# Patient Record
Sex: Male | Born: 1955 | Race: White | Hispanic: No | Marital: Married | Smoking: Former smoker
Health system: Southern US, Community
[De-identification: ages and names within clinical notes are randomized; demographics above are authoritative.]

## PROBLEM LIST (undated history)

## (undated) DIAGNOSIS — G4733 Obstructive sleep apnea (adult) (pediatric): Secondary | ICD-10-CM

## (undated) DIAGNOSIS — J45909 Unspecified asthma, uncomplicated: Secondary | ICD-10-CM

## (undated) DIAGNOSIS — Z87442 Personal history of urinary calculi: Secondary | ICD-10-CM

## (undated) DIAGNOSIS — G4730 Sleep apnea, unspecified: Secondary | ICD-10-CM

## (undated) DIAGNOSIS — I872 Venous insufficiency (chronic) (peripheral): Secondary | ICD-10-CM

## (undated) DIAGNOSIS — J189 Pneumonia, unspecified organism: Secondary | ICD-10-CM

## (undated) DIAGNOSIS — Z5320 Procedure and treatment not carried out because of patient's decision for unspecified reasons: Secondary | ICD-10-CM

## (undated) HISTORY — DX: Unspecified asthma, uncomplicated: J45.909

## (undated) HISTORY — PX: HEMORROIDECTOMY: SUR656

## (undated) HISTORY — DX: Obstructive sleep apnea (adult) (pediatric): G47.33

## (undated) HISTORY — DX: Morbid (severe) obesity due to excess calories: E66.01

## (undated) HISTORY — DX: Procedure and treatment not carried out because of patient's decision for unspecified reasons: Z53.20

## (undated) HISTORY — DX: Venous insufficiency (chronic) (peripheral): I87.2

---

## 1998-05-23 ENCOUNTER — Ambulatory Visit (HOSPITAL_COMMUNITY): Admission: RE | Admit: 1998-05-23 | Discharge: 1998-05-23 | Payer: Self-pay | Admitting: General Surgery

## 1998-05-23 ENCOUNTER — Emergency Department (HOSPITAL_COMMUNITY): Admission: EM | Admit: 1998-05-23 | Discharge: 1998-05-23 | Payer: Self-pay | Admitting: Emergency Medicine

## 2014-10-22 SURGERY — Surgical Case
Anesthesia: *Unknown

## 2015-12-21 DIAGNOSIS — Z1389 Encounter for screening for other disorder: Secondary | ICD-10-CM | POA: Diagnosis not present

## 2015-12-21 DIAGNOSIS — Z6841 Body Mass Index (BMI) 40.0 and over, adult: Secondary | ICD-10-CM | POA: Diagnosis not present

## 2015-12-21 DIAGNOSIS — R1032 Left lower quadrant pain: Secondary | ICD-10-CM | POA: Diagnosis not present

## 2015-12-27 DIAGNOSIS — R1032 Left lower quadrant pain: Secondary | ICD-10-CM | POA: Diagnosis not present

## 2015-12-27 DIAGNOSIS — R1084 Generalized abdominal pain: Secondary | ICD-10-CM | POA: Diagnosis not present

## 2016-02-18 DIAGNOSIS — J208 Acute bronchitis due to other specified organisms: Secondary | ICD-10-CM | POA: Diagnosis not present

## 2016-06-16 DIAGNOSIS — M79631 Pain in right forearm: Secondary | ICD-10-CM | POA: Diagnosis not present

## 2016-06-16 DIAGNOSIS — M791 Myalgia: Secondary | ICD-10-CM | POA: Diagnosis not present

## 2016-06-16 DIAGNOSIS — Z6838 Body mass index (BMI) 38.0-38.9, adult: Secondary | ICD-10-CM | POA: Diagnosis not present

## 2016-06-17 DIAGNOSIS — M25521 Pain in right elbow: Secondary | ICD-10-CM | POA: Diagnosis not present

## 2016-06-19 DIAGNOSIS — M25521 Pain in right elbow: Secondary | ICD-10-CM | POA: Diagnosis not present

## 2016-06-19 DIAGNOSIS — M19021 Primary osteoarthritis, right elbow: Secondary | ICD-10-CM | POA: Diagnosis not present

## 2016-06-22 DIAGNOSIS — M25521 Pain in right elbow: Secondary | ICD-10-CM | POA: Diagnosis not present

## 2017-01-19 HISTORY — PX: DENTAL SURGERY: SHX609

## 2017-07-06 DIAGNOSIS — R1031 Right lower quadrant pain: Secondary | ICD-10-CM | POA: Diagnosis not present

## 2017-07-06 DIAGNOSIS — K4030 Unilateral inguinal hernia, with obstruction, without gangrene, not specified as recurrent: Secondary | ICD-10-CM | POA: Diagnosis not present

## 2017-07-06 DIAGNOSIS — J45998 Other asthma: Secondary | ICD-10-CM | POA: Diagnosis not present

## 2017-07-08 DIAGNOSIS — Z01818 Encounter for other preprocedural examination: Secondary | ICD-10-CM | POA: Diagnosis not present

## 2017-07-08 DIAGNOSIS — Z01810 Encounter for preprocedural cardiovascular examination: Secondary | ICD-10-CM | POA: Diagnosis not present

## 2017-07-08 DIAGNOSIS — K4090 Unilateral inguinal hernia, without obstruction or gangrene, not specified as recurrent: Secondary | ICD-10-CM | POA: Diagnosis not present

## 2017-07-16 DIAGNOSIS — T8379XA Other specified complications due to other genitourinary prosthetic materials, initial encounter: Secondary | ICD-10-CM | POA: Diagnosis not present

## 2017-07-16 DIAGNOSIS — K420 Umbilical hernia with obstruction, without gangrene: Secondary | ICD-10-CM | POA: Diagnosis not present

## 2017-07-16 DIAGNOSIS — R001 Bradycardia, unspecified: Secondary | ICD-10-CM | POA: Diagnosis not present

## 2017-07-16 DIAGNOSIS — G4733 Obstructive sleep apnea (adult) (pediatric): Secondary | ICD-10-CM | POA: Diagnosis not present

## 2017-07-16 DIAGNOSIS — K4030 Unilateral inguinal hernia, with obstruction, without gangrene, not specified as recurrent: Secondary | ICD-10-CM | POA: Diagnosis not present

## 2017-07-16 DIAGNOSIS — R55 Syncope and collapse: Secondary | ICD-10-CM | POA: Diagnosis not present

## 2017-07-16 DIAGNOSIS — G8918 Other acute postprocedural pain: Secondary | ICD-10-CM | POA: Diagnosis not present

## 2017-07-16 DIAGNOSIS — K4090 Unilateral inguinal hernia, without obstruction or gangrene, not specified as recurrent: Secondary | ICD-10-CM | POA: Diagnosis not present

## 2017-07-16 DIAGNOSIS — K429 Umbilical hernia without obstruction or gangrene: Secondary | ICD-10-CM | POA: Diagnosis not present

## 2017-07-16 DIAGNOSIS — J45909 Unspecified asthma, uncomplicated: Secondary | ICD-10-CM | POA: Diagnosis not present

## 2017-07-17 DIAGNOSIS — K4030 Unilateral inguinal hernia, with obstruction, without gangrene, not specified as recurrent: Secondary | ICD-10-CM | POA: Diagnosis not present

## 2017-07-17 DIAGNOSIS — K429 Umbilical hernia without obstruction or gangrene: Secondary | ICD-10-CM | POA: Diagnosis not present

## 2017-07-17 DIAGNOSIS — R001 Bradycardia, unspecified: Secondary | ICD-10-CM | POA: Diagnosis not present

## 2017-07-19 DIAGNOSIS — K4030 Unilateral inguinal hernia, with obstruction, without gangrene, not specified as recurrent: Secondary | ICD-10-CM | POA: Diagnosis not present

## 2017-07-19 DIAGNOSIS — K429 Umbilical hernia without obstruction or gangrene: Secondary | ICD-10-CM | POA: Diagnosis not present

## 2019-04-03 ENCOUNTER — Ambulatory Visit: Payer: Self-pay | Admitting: General Surgery

## 2019-04-03 NOTE — H&P (View-Only) (Signed)
History of Present Illness (Yahia Bottger MD; 04/03/2019 2:02 PM) The patient is a 63 year old male who presents with an incisional hernia. Chief Complaint: Recurrent incisional hernia  Patient is a 63-year-old male who previously had undergone a laparoscopic left inguinal hernia repair with mesh and laparoscopic umbilical hernia repair with mesh, in 2017. Patient subsequently had a attempted right robotic inguinal hernia repair which resulted in bleeding from the epigastric vessel that required clipping. This also required exploratory laparotomy via a large midline incision. This is done in 2019. Patient states that approximately 6 months after surgery he noticed a bulge upper epigastric region. He states that since that time the bulge is getting larger. He states he does have some discomfort and pain to both hernias. Patient recently underwent CT scan which I reviewed personally. 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 Patient had no signs or symptoms of incarceration or strangulation.    Past Surgical History (Chanel Nolan, CMA; 04/03/2019 1:39 PM) Anal Fissure Repair  Laparoscopic Inguinal Hernia Surgery  Right.  Diagnostic Studies History (Chanel Nolan, CMA; 04/03/2019 1:39 PM) Colonoscopy  never  Allergies (Chanel Nolan, CMA; 04/03/2019 1:39 PM) No Known Allergies [04/03/2019]: No Known Drug Allergies [04/03/2019]: Allergies Reconciled   Medication History (Chanel Nolan, CMA; 04/03/2019 1:39 PM) No Current Medications Medications Reconciled  Social History (Chanel Nolan, CMA; 04/03/2019 1:39 PM) Alcohol use  Occasional alcohol use. Caffeine use  Coffee, Tea. No drug use  Tobacco use  Never smoker.  Family History (Chanel Nolan, CMA; 04/03/2019 1:39 PM) Arthritis  Mother. Cancer  Father. Diabetes Mellitus  Mother.  Other Problems (Chanel Nolan, CMA; 04/03/2019 1:39 PM) Asthma   Hemorrhoids  Inguinal Hernia  Umbilical Hernia Repair     Review of Systems (Kelcy Baeten MD; 04/03/2019 2:00 PM) General Not Present- Appetite Loss, Chills, Fatigue, Fever, Night Sweats, Weight Gain and Weight Loss. Skin Not Present- Change in Wart/Mole, Dryness, Hives, Jaundice, New Lesions, Non-Healing Wounds, Rash and Ulcer. HEENT Present- Seasonal Allergies and Wears glasses/contact lenses. Not Present- Earache, Hearing Loss, Hoarseness, Nose Bleed, Oral Ulcers, Ringing in the Ears, Sinus Pain, Sore Throat, Visual Disturbances and Yellow Eyes. Respiratory Not Present- Bloody sputum, Chronic Cough, Difficulty Breathing, Snoring and Wheezing. Breast Not Present- Breast Mass, Breast Pain, Nipple Discharge and Skin Changes. Cardiovascular Not Present- Chest Pain, Difficulty Breathing Lying Down, Leg Cramps, Palpitations, Rapid Heart Rate, Shortness of Breath and Swelling of Extremities. Gastrointestinal Present- Abdominal Pain. Not Present- Bloating, Bloody Stool, Change in Bowel Habits, Chronic diarrhea, Constipation, Difficulty Swallowing, Excessive gas, Gets full quickly at meals, Hemorrhoids, Indigestion, Nausea, Rectal Pain and Vomiting. Male Genitourinary Not Present- Blood in Urine, Change in Urinary Stream, Frequency, Impotence, Nocturia, Painful Urination, Urgency and Urine Leakage. Musculoskeletal Not Present- Back Pain, Joint Pain, Joint Stiffness, Muscle Pain, Muscle Weakness and Swelling of Extremities. Neurological Not Present- Decreased Memory, Fainting, Headaches, Numbness, Seizures, Tingling, Tremor, Trouble walking and Weakness. Psychiatric Not Present- Anxiety, Bipolar, Change in Sleep Pattern, Depression, Fearful and Frequent crying. Endocrine Not Present- Cold Intolerance, Excessive Hunger, Hair Changes, Heat Intolerance, Hot flashes and New Diabetes. Hematology Not Present- Blood Thinners, Easy Bruising, Excessive bleeding, Gland problems, HIV and Persistent  Infections. All other systems negative  Vitals (Chanel Nolan CMA; 04/03/2019 1:40 PM) 04/03/2019 1:39 PM Weight: 281.5 lb Height: 72in Body Surface Area: 2.46 m Body Mass Index: 38.18 kg/m  Temp.: 98.3F  Pulse: 75 (Regular)        Physical Exam (  Axel Filler MD; 04/03/2019 2:02 PM) The physical exam findings are as follows: Note:Constitutional: No acute distress, conversant, appears stated age  Eyes: Anicteric sclerae, moist conjunctiva, no lid lag  Neck: No thyromegaly, trachea midline, no cervical lymphadenopathy  Lungs: Clear to auscultation biilaterally, normal respiratory effot  Cardiovascular: regular rate & rhythm, no murmurs, no peripheal edema, pedal pulses 2+  GI: Soft, no masses or hepatosplenomegaly, non-tender to palpation  MSK: Normal gait, no clubbing cyanosis, edema  Skin: No rashes, palpation reveals normal skin turgor  Psychiatric: Appropriate judgment and insight, oriented to person, place, and time  Abdomen Inspection Hernias - Incisional - Reducible(Large midline incision, reducible multiple defects).    Assessment & Plan Axel Filler MD; 04/03/2019 2:04 PM) INCISIONAL HERNIA, WITHOUT OBSTRUCTION OR GANGRENE (K43.2) Impression: Patient is a 63 year old male with a history of a recurrent incisional hernia, midline 1. Will proceed to operative for exploratory laparoscopy, lysis of adhesions, incisional hernia repair with mesh, TAR versus retrorectus. 2. All risks and benefits were discussed with the patient to generally include, but not limited to: infection, bleeding, damage to surrounding structures, acute and chronic nerve pain, and recurrence. Alternatives were offered and described. All questions were answered and the patient voiced understanding of the procedure and wishes to proceed at this point with hernia repair.   I reviewed the patient's external notes from the referring physicians as well as consulting physician team.  Each of the radiologic studies and lab studies were independently reviewed and interpreted. I discussed the results of the above studies and how they relate to the patient's surgical problems.

## 2019-04-03 NOTE — H&P (Signed)
History of Present Illness Ralene Ok MD; 04/03/2019 2:02 PM) The patient is a 64 year old male who presents with an incisional hernia. Chief Complaint: Recurrent incisional hernia  Patient is a 64 year old male who previously had undergone a laparoscopic left inguinal hernia repair with mesh and laparoscopic umbilical hernia repair with mesh, in 2017. Patient subsequently had a attempted right robotic inguinal hernia repair which resulted in bleeding from the epigastric vessel that required clipping. This also required exploratory laparotomy via a large midline incision. This is done in 2019. Patient states that approximately 6 months after surgery he noticed a bulge upper epigastric region. He states that since that time the bulge is getting larger. He states he does have some discomfort and pain to both hernias. Patient recently underwent CT scan which I reviewed personally. 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 Patient had no signs or symptoms of incarceration or strangulation.    Past Surgical History [facility] Teressa Senter, [place]; 04/03/2019 1:39 PM) Anal Fissure Repair  Laparoscopic Inguinal Hernia Surgery  Right.  Diagnostic Studies History [facility] Teressa Senter, CMA; 04/03/2019 1:39 PM) Colonoscopy  never  Allergies (Chanel Teressa Senter, CMA; 04/03/2019 1:39 PM) No Known Allergies [04/03/2019]: No Known Drug Allergies [04/03/2019]: Allergies Reconciled   Medication History (Chanel Teressa Senter, [place]; 04/03/2019 1:39 PM) No Current Medications Medications Reconciled  Social History Antonietta Jewel, CMA; 04/03/2019 1:39 PM) Alcohol use  Occasional alcohol use. Caffeine use  Coffee, Tea. No drug use  Tobacco use  Never smoker.  Family History Antonietta Jewel, [place]; 04/03/2019 1:39 PM) Arthritis  Mother. Cancer  Father. Diabetes Mellitus  Mother.  Other Problems Antonietta Jewel, CMA; 04/03/2019 1:39 PM) Asthma   Hemorrhoids  Inguinal Hernia  Umbilical Hernia Repair     Review of Systems Ralene Ok MD; 04/03/2019 2:00 PM) General Not Present- Appetite Loss, Chills, Fatigue, Fever, Night Sweats, Weight Gain and Weight Loss. Skin Not Present- Change in Wart/Mole, Dryness, Hives, Jaundice, New Lesions, Non-Healing Wounds, Rash and Ulcer. HEENT Present- Seasonal Allergies and Wears glasses/contact lenses. Not Present- Earache, Hearing Loss, Hoarseness, Nose Bleed, Oral Ulcers, Ringing in the Ears, Sinus Pain, Sore Throat, Visual Disturbances and Yellow Eyes. Respiratory Not Present- Bloody sputum, Chronic Cough, Difficulty Breathing, Snoring and Wheezing. Breast Not Present- Breast Mass, Breast Pain, Nipple Discharge and Skin Changes. Cardiovascular Not Present- Chest Pain, Difficulty Breathing Lying Down, Leg Cramps, Palpitations, Rapid Heart Rate, Shortness of Breath and Swelling of Extremities. Gastrointestinal Present- Abdominal Pain. Not Present- Bloating, Bloody Stool, Change in Bowel Habits, Chronic diarrhea, Constipation, Difficulty Swallowing, Excessive gas, Gets full quickly at meals, Hemorrhoids, Indigestion, Nausea, Rectal Pain and Vomiting. Male Genitourinary Not Present- Blood in Urine, Change in Urinary Stream, Frequency, Impotence, Nocturia, Painful Urination, Urgency and Urine Leakage. Musculoskeletal Not Present- Back Pain, Joint Pain, Joint Stiffness, Muscle Pain, Muscle Weakness and Swelling of Extremities. Neurological Not Present- Decreased Memory, Fainting, Headaches, Numbness, Seizures, Tingling, Tremor, Trouble walking and Weakness. Psychiatric Not Present- Anxiety, Bipolar, Change in Sleep Pattern, Depression, Fearful and Frequent crying. Endocrine Not Present- Cold Intolerance, Excessive Hunger, Hair Changes, Heat Intolerance, Hot flashes and New Diabetes. Hematology Not Present- Blood Thinners, Easy Bruising, Excessive bleeding, Gland problems, HIV and Persistent  Infections. All other systems negative  Vitals (Chanel Nolan CMA; 04/03/2019 1:40 PM) 04/03/2019 1:39 PM Weight: 281.5 lb Height: 72in Body Surface Area: 2.46 m Body Mass Index: 38.18 kg/m  Temp.: 98.21F  Pulse: 75 (Regular)        Physical Exam (  Axel Filler MD; 04/03/2019 2:02 PM) The physical exam findings are as follows: Note:Constitutional: No acute distress, conversant, appears stated age  Eyes: Anicteric sclerae, moist conjunctiva, no lid lag  Neck: No thyromegaly, trachea midline, no cervical lymphadenopathy  Lungs: Clear to auscultation biilaterally, normal respiratory effot  Cardiovascular: regular rate & rhythm, no murmurs, no peripheal edema, pedal pulses 2+  GI: Soft, no masses or hepatosplenomegaly, non-tender to palpation  MSK: Normal gait, no clubbing cyanosis, edema  Skin: No rashes, palpation reveals normal skin turgor  Psychiatric: Appropriate judgment and insight, oriented to person, place, and time  Abdomen Inspection Hernias - Incisional - Reducible(Large midline incision, reducible multiple defects).    Assessment & Plan Axel Filler MD; 04/03/2019 2:04 PM) INCISIONAL HERNIA, WITHOUT OBSTRUCTION OR GANGRENE (K43.2) Impression: Patient is a 63 year old male with a history of a recurrent incisional hernia, midline 1. Will proceed to operative for exploratory laparoscopy, lysis of adhesions, incisional hernia repair with mesh, TAR versus retrorectus. 2. All risks and benefits were discussed with the patient to generally include, but not limited to: infection, bleeding, damage to surrounding structures, acute and chronic nerve pain, and recurrence. Alternatives were offered and described. All questions were answered and the patient voiced understanding of the procedure and wishes to proceed at this point with hernia repair.   I reviewed the patient's external notes from the referring physicians as well as consulting physician team.  Each of the radiologic studies and lab studies were independently reviewed and interpreted. I discussed the results of the above studies and how they relate to the patient's surgical problems.

## 2019-04-18 NOTE — Progress Notes (Signed)
Jacob Page  Your procedure is scheduled on Wednesday April 7.  Report to [facility] Main Entrance "A" at 08:15 A.M., and check in at the Admitting office.  Call this number if you have problems the morning of surgery: [phone]  Call [phone] if you have any questions prior to your surgery date Monday-Friday 8am-4pm   Remember: Do not eat after midnight the night before your surgery  You may drink clear liquids until 07:15 A.M. the morning of your surgery.   Clear liquids allowed are: Water, Non-Citrus Juices (without pulp), Carbonated Beverages, Clear Tea, Black Coffee Only, and Gatorade   No medications morning of surgery.  As of today, STOP taking any Aspirin (unless otherwise instructed by your surgeon), Aleve, Naproxen, Ibuprofen, Motrin, Advil, Goody's, BC's, all herbal medications, fish oil, and all vitamins.    The Morning of Surgery  Do not wear jewelry  Do not wear lotions, powders, perfumes, or deodorant Men may shave face and neck.  Do not bring valuables to the hospital.  [facility] is not responsible for any belongings or valuables.  If you are a smoker, DO NOT Smoke 24 hours prior to surgery  If you wear a CPAP at night please bring your mask the morning of surgery   Remember that you must have someone to transport you home after your surgery, and remain with you for 24 hours if you are discharged the same day.   Please bring cases for contacts, glasses, hearing aids, dentures or bridgework because it cannot be worn into surgery.    Leave your suitcase in the car.  After surgery it may be brought to your room.  For patients admitted to the hospital, discharge time will be determined by your treatment team.  Patients discharged the day of surgery will not be allowed to drive home.    Special instructions:   [place]- Preparing For Surgery  Before surgery, you can play an important role. Because skin is not sterile, your skin needs to be as  free of germs as possible. You can reduce the number of germs on your skin by washing with CHG (chlorahexidine gluconate) Soap before surgery.  CHG is an antiseptic cleaner which kills germs and bonds with the skin to continue killing germs even after washing.    Oral Hygiene is also important to reduce your risk of infection.  Remember - BRUSH YOUR TEETH THE MORNING OF SURGERY WITH YOUR REGULAR TOOTHPASTE  Please do not use if you have an allergy to CHG or antibacterial soaps. If your skin becomes reddened/irritated stop using the CHG.  Do not shave (including legs and underarms) for at least 48 hours prior to first CHG shower. It is OK to shave your face.  Please follow these instructions carefully.   1. Shower the NIGHT BEFORE SURGERY and the MORNING OF SURGERY with CHG Soap.   2. If you chose to wash your hair and body, wash as usual with your normal shampoo and body-wash/soap.  3. Rinse your hair and body thoroughly to remove the shampoo and soap.  4. Apply CHG directly to the skin (ONLY FROM THE NECK DOWN) and wash gently with a scrungie or a clean washcloth.   5. Do not use on open wounds or open sores. Avoid contact with your eyes, ears, mouth and genitals (private parts). Wash Face and genitals (private parts)  with your normal soap.   6. Wash thoroughly, paying special attention to the area where your surgery will be  performed.  7. Thoroughly rinse your body with warm water from the neck down.  8. DO NOT shower/wash with your normal soap after using and rinsing off the CHG Soap.  9. Pat yourself dry with a CLEAN TOWEL.  10. Wear CLEAN PAJAMAS to bed the night before surgery  11. Place CLEAN SHEETS on your bed the night of your first shower and DO NOT SLEEP WITH PETS.  12. Wear comfortable clothes the morning of surgery.     Day of Surgery:  Please shower the morning of surgery with the CHG soap Do not apply any deodorants/lotions. Please wear clean clothes to the  hospital/surgery center.   Remember to brush your teeth WITH YOUR REGULAR TOOTHPASTE.   Please read over the following fact sheets that you were given.

## 2019-04-19 ENCOUNTER — Other Ambulatory Visit: Payer: Self-pay

## 2019-04-19 ENCOUNTER — Encounter (HOSPITAL_COMMUNITY): Payer: Self-pay

## 2019-04-19 ENCOUNTER — Encounter (HOSPITAL_COMMUNITY)
Admission: RE | Admit: 2019-04-19 | Discharge: 2019-04-19 | Disposition: A | Discharge status: Home / Self Care | Payer: 59 | Source: Ambulatory Visit | Attending: General Surgery | Admitting: General Surgery

## 2019-04-19 DIAGNOSIS — Z01812 Encounter for preprocedural laboratory examination: Secondary | ICD-10-CM | POA: Insufficient documentation

## 2019-04-19 HISTORY — DX: Sleep apnea, unspecified: G47.30

## 2019-04-19 HISTORY — DX: Personal history of urinary calculi: Z87.442

## 2019-04-19 HISTORY — DX: Pneumonia, unspecified organism: J18.9

## 2019-04-19 LAB — CBC
HCT: 48.2 % (ref 39.0–52.0)
Hemoglobin: 15.9 g/dL (ref 13.0–17.0)
MCH: 28.9 pg (ref 26.0–34.0)
MCHC: 33 g/dL (ref 30.0–36.0)
MCV: 87.5 fL (ref 80.0–100.0)
Platelets: 276 10*3/uL (ref 150–400)
RBC: 5.51 MIL/uL (ref 4.22–5.81)
RDW: 13.2 % (ref 11.5–15.5)
WBC: 7.7 10*3/uL (ref 4.0–10.5)
nRBC: 0 % (ref 0.0–0.2)

## 2019-04-19 NOTE — Progress Notes (Addendum)
PCP - Dr. 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 He has no cardiac history, doesn't take any medications for blood pressure management.  ECHO - pt denies Cardiac Cath - pt denies  Sleep Study - yes - per pt "sleep study was done where I had to stay overnight. It was about 35 years ago. I never got a CPAP because I felt that I slept fine and didn't need it." CPAP - no  Per pt, wife checks his blood sugars occasionally (wife is diabetic, so they have a CBG meter), but his sugars are never over 120s.   Per pt he has had 2 previous hernia repairs.  [facility] hospital - 2017 [facility] - July 17, 2017 (care everywhere)   Fayrene Fearing, [place] aware - documents not requested.   ERAS Protcol - yes - no drink ordered   COVID TEST- 04/22/19  Coronavirus Screening  Have you experienced the following symptoms:  Cough yes/no: No Fever (>100.64F)  yes/no: No Runny nose yes/no: No Sore throat yes/no: No Difficulty breathing/shortness of breath  yes/no: No  Have you or a family member traveled in the last 14 days and where? yes/no: No   If the patient indicates "YES" to the above questions, their PAT will be rescheduled to limit the exposure to others and, the surgeon will be notified. THE PATIENT WILL NEED TO BE ASYMPTOMATIC FOR 14 DAYS.   If the patient is not experiencing any of these symptoms, the PAT nurse will instruct them to NOT bring anyone with them to their appointment since they may have these symptoms or traveled as well.   Please remind your patients and families that hospital visitation restrictions are in effect and the importance of the restrictions.     Anesthesia review: n/a   Patient denies shortness of breath, fever, cough and chest pain at PAT appointment   All instructions explained to the  patient, with a verbal understanding of the material. Patient agrees to go over the instructions while at home for a better understanding. Patient also instructed to self quarantine after being tested for COVID-19. The opportunity to ask questions was provided.

## 2019-04-22 ENCOUNTER — Other Ambulatory Visit (HOSPITAL_COMMUNITY)
Admission: RE | Admit: 2019-04-22 | Discharge: 2019-04-22 | Disposition: A | Discharge status: Home / Self Care | Payer: 59 | Source: Ambulatory Visit | Attending: General Surgery | Admitting: General Surgery

## 2019-04-22 DIAGNOSIS — Z20822 Contact with and (suspected) exposure to covid-19: Secondary | ICD-10-CM | POA: Diagnosis not present

## 2019-04-22 DIAGNOSIS — Z01812 Encounter for preprocedural laboratory examination: Secondary | ICD-10-CM | POA: Insufficient documentation

## 2019-04-22 LAB — SARS CORONAVIRUS 2 (TAT 6-24 HRS): SARS Coronavirus 2: NEGATIVE

## 2019-04-25 MED ORDER — DEXTROSE 5 % IV SOLN
3.0000 g | INTRAVENOUS | Status: AC
  Administered 2019-04-26: 10:00:00 3 g via INTRAVENOUS
  Filled 2019-04-25: qty 3000
  Filled 2019-04-25: qty 3

## 2019-04-26 ENCOUNTER — Inpatient Hospital Stay (HOSPITAL_COMMUNITY)
Admission: RE | Admit: 2019-04-26 | Discharge: 2019-04-29 | DRG: 337 | Disposition: A | Discharge status: Home / Self Care | Payer: 59 | Attending: General Surgery | Admitting: General Surgery

## 2019-04-26 ENCOUNTER — Ambulatory Visit (HOSPITAL_COMMUNITY): Payer: 59 | Admitting: Physician Assistant

## 2019-04-26 ENCOUNTER — Encounter (HOSPITAL_COMMUNITY)
Admission: RE | Disposition: A | Discharge status: Home / Self Care | Payer: Self-pay | Source: Home / Self Care | Attending: General Surgery

## 2019-04-26 ENCOUNTER — Encounter (HOSPITAL_COMMUNITY): Payer: Self-pay | Admitting: General Surgery

## 2019-04-26 ENCOUNTER — Ambulatory Visit (HOSPITAL_COMMUNITY): Payer: 59 | Admitting: Certified Registered"

## 2019-04-26 ENCOUNTER — Other Ambulatory Visit: Payer: Self-pay

## 2019-04-26 DIAGNOSIS — Z9889 Other specified postprocedural states: Secondary | ICD-10-CM

## 2019-04-26 DIAGNOSIS — K660 Peritoneal adhesions (postprocedural) (postinfection): Secondary | ICD-10-CM | POA: Diagnosis present

## 2019-04-26 DIAGNOSIS — Z833 Family history of diabetes mellitus: Secondary | ICD-10-CM

## 2019-04-26 DIAGNOSIS — Z8719 Personal history of other diseases of the digestive system: Secondary | ICD-10-CM

## 2019-04-26 DIAGNOSIS — Z8261 Family history of arthritis: Secondary | ICD-10-CM

## 2019-04-26 DIAGNOSIS — J45909 Unspecified asthma, uncomplicated: Secondary | ICD-10-CM | POA: Diagnosis present

## 2019-04-26 DIAGNOSIS — M62838 Other muscle spasm: Secondary | ICD-10-CM | POA: Diagnosis not present

## 2019-04-26 DIAGNOSIS — G4730 Sleep apnea, unspecified: Secondary | ICD-10-CM | POA: Diagnosis present

## 2019-04-26 DIAGNOSIS — K432 Incisional hernia without obstruction or gangrene: Principal | ICD-10-CM | POA: Diagnosis present

## 2019-04-26 DIAGNOSIS — Z87891 Personal history of nicotine dependence: Secondary | ICD-10-CM

## 2019-04-26 HISTORY — PX: INCISIONAL HERNIA REPAIR: SHX193

## 2019-04-26 HISTORY — PX: EXPLORATORY LAPAROTOMY: SUR591

## 2019-04-26 HISTORY — PX: LYSIS OF ADHESION: SHX5961

## 2019-04-26 HISTORY — PX: INSERTION OF MESH: SHX5868

## 2019-04-26 HISTORY — PX: LAPAROTOMY: SHX154

## 2019-04-26 LAB — TYPE AND SCREEN
ABO/RH(D): A NEG
Antibody Screen: NEGATIVE

## 2019-04-26 LAB — ABO/RH: ABO/RH(D): A NEG

## 2019-04-26 SURGERY — LAPAROTOMY, EXPLORATORY
Anesthesia: General | Site: Abdomen

## 2019-04-26 MED ORDER — ALUM & MAG HYDROXIDE-SIMETH 200-200-20 MG/5ML PO SUSP
30.0000 mL | Freq: Four times a day (QID) | ORAL | Status: DC | PRN
  Administered 2019-04-26 – 2019-04-27 (×2): 30 mL via ORAL
  Filled 2019-04-26 (×3): qty 30

## 2019-04-26 MED ORDER — PHENYLEPHRINE HCL-NACL 10-0.9 MG/250ML-% IV SOLN
INTRAVENOUS | Status: DC | PRN
  Administered 2019-04-26: 30 ug/min via INTRAVENOUS

## 2019-04-26 MED ORDER — PROMETHAZINE HCL 25 MG/ML IJ SOLN
INTRAMUSCULAR | Status: AC
  Filled 2019-04-26: qty 1

## 2019-04-26 MED ORDER — OXYCODONE HCL 5 MG PO TABS: 5.0000 mg | ORAL_TABLET | Freq: Once | ORAL | Status: AC | PRN

## 2019-04-26 MED ORDER — HYDRALAZINE HCL 20 MG/ML IJ SOLN: 10.0000 mg | INTRAMUSCULAR | Status: DC | PRN

## 2019-04-26 MED ORDER — ACETAMINOPHEN 10 MG/ML IV SOLN
1000.0000 mg | Freq: Four times a day (QID) | INTRAVENOUS | Status: AC
  Administered 2019-04-26 – 2019-04-27 (×4): 1000 mg via INTRAVENOUS
  Filled 2019-04-26 (×4): qty 100

## 2019-04-26 MED ORDER — ONDANSETRON 4 MG PO TBDP: 4.0000 mg | ORAL_TABLET | Freq: Four times a day (QID) | ORAL | Status: DC | PRN

## 2019-04-26 MED ORDER — BUPIVACAINE LIPOSOME 1.3 % IJ SUSP
INTRAMUSCULAR | Status: DC | PRN
  Administered 2019-04-26: 20 mL

## 2019-04-26 MED ORDER — ACETAMINOPHEN 500 MG PO TABS: 1000.0000 mg | ORAL_TABLET | ORAL | Status: AC

## 2019-04-26 MED ORDER — ONDANSETRON HCL 4 MG/2ML IJ SOLN
INTRAMUSCULAR | Status: DC | PRN
  Administered 2019-04-26: 4 mg via INTRAVENOUS

## 2019-04-26 MED ORDER — PROMETHAZINE HCL 25 MG/ML IJ SOLN
6.2500 mg | INTRAMUSCULAR | Status: DC | PRN
  Administered 2019-04-26: 6.25 mg via INTRAVENOUS

## 2019-04-26 MED ORDER — PROPOFOL 10 MG/ML IV BOLUS
INTRAVENOUS | Status: DC | PRN
  Administered 2019-04-26: 180 mg via INTRAVENOUS

## 2019-04-26 MED ORDER — OXYCODONE HCL 5 MG/5ML PO SOLN
5.0000 mg | Freq: Once | ORAL | Status: AC | PRN
  Administered 2019-04-26: 5 mg via ORAL

## 2019-04-26 MED ORDER — HYDROMORPHONE HCL 1 MG/ML IJ SOLN
INTRAMUSCULAR | Status: AC
  Filled 2019-04-26: qty 1

## 2019-04-26 MED ORDER — DEXAMETHASONE SODIUM PHOSPHATE 10 MG/ML IJ SOLN
INTRAMUSCULAR | Status: DC | PRN
  Administered 2019-04-26: 10 mg via INTRAVENOUS

## 2019-04-26 MED ORDER — SUCCINYLCHOLINE CHLORIDE 200 MG/10ML IV SOSY
PREFILLED_SYRINGE | INTRAVENOUS | Status: DC | PRN
  Administered 2019-04-26: 140 mg via INTRAVENOUS

## 2019-04-26 MED ORDER — SUGAMMADEX SODIUM 200 MG/2ML IV SOLN
INTRAVENOUS | Status: DC | PRN
  Administered 2019-04-26: 400 mg via INTRAVENOUS

## 2019-04-26 MED ORDER — MEPERIDINE HCL 25 MG/ML IJ SOLN: 6.2500 mg | INTRAMUSCULAR | Status: DC | PRN

## 2019-04-26 MED ORDER — HYDROMORPHONE HCL 1 MG/ML IJ SOLN
1.0000 mg | INTRAMUSCULAR | Status: DC | PRN
  Administered 2019-04-26 – 2019-04-28 (×6): 1 mg via INTRAVENOUS
  Filled 2019-04-26 (×7): qty 1

## 2019-04-26 MED ORDER — SODIUM CHLORIDE (PF) 0.9 % IJ SOLN
INTRAMUSCULAR | Status: DC | PRN
  Administered 2019-04-26 (×2): 10 mL

## 2019-04-26 MED ORDER — STERILE WATER FOR IRRIGATION IR SOLN
Status: DC | PRN
  Administered 2019-04-26: 1000 mL

## 2019-04-26 MED ORDER — DEXTROSE-NACL 5-0.9 % IV SOLN: INTRAVENOUS | Status: DC

## 2019-04-26 MED ORDER — CHLORHEXIDINE GLUCONATE CLOTH 2 % EX PADS: 6.0000 | MEDICATED_PAD | Freq: Once | CUTANEOUS | Status: DC

## 2019-04-26 MED ORDER — CEFAZOLIN SODIUM-DEXTROSE 2-4 GM/100ML-% IV SOLN
INTRAVENOUS | Status: AC
  Filled 2019-04-26: qty 100

## 2019-04-26 MED ORDER — FENTANYL CITRATE (PF) 250 MCG/5ML IJ SOLN
INTRAMUSCULAR | Status: AC
  Filled 2019-04-26: qty 5

## 2019-04-26 MED ORDER — BUPIVACAINE LIPOSOME 1.3 % IJ SUSP
20.0000 mL | Freq: Once | INTRAMUSCULAR | Status: DC
  Filled 2019-04-26: qty 20

## 2019-04-26 MED ORDER — OXYCODONE HCL 5 MG/5ML PO SOLN
ORAL | Status: AC
  Filled 2019-04-26: qty 5

## 2019-04-26 MED ORDER — HYDROMORPHONE HCL 1 MG/ML IJ SOLN
0.2500 mg | INTRAMUSCULAR | Status: DC | PRN
  Administered 2019-04-26 (×4): 0.5 mg via INTRAVENOUS

## 2019-04-26 MED ORDER — KETOROLAC TROMETHAMINE 30 MG/ML IJ SOLN
30.0000 mg | Freq: Four times a day (QID) | INTRAMUSCULAR | Status: DC | PRN
  Administered 2019-04-26 – 2019-04-29 (×7): 30 mg via INTRAVENOUS
  Filled 2019-04-26 (×6): qty 1

## 2019-04-26 MED ORDER — PROPOFOL 10 MG/ML IV BOLUS
INTRAVENOUS | Status: AC
  Filled 2019-04-26: qty 20

## 2019-04-26 MED ORDER — ROCURONIUM BROMIDE 50 MG/5ML IV SOSY
PREFILLED_SYRINGE | INTRAVENOUS | Status: DC | PRN
  Administered 2019-04-26: 10 mg via INTRAVENOUS
  Administered 2019-04-26: 100 mg via INTRAVENOUS
  Administered 2019-04-26: 20 mg via INTRAVENOUS
  Administered 2019-04-26: 10 mg via INTRAVENOUS
  Administered 2019-04-26: 20 mg via INTRAVENOUS

## 2019-04-26 MED ORDER — PHENYLEPHRINE HCL (PRESSORS) 10 MG/ML IV SOLN
INTRAVENOUS | Status: DC | PRN
  Administered 2019-04-26 (×2): 80 ug via INTRAVENOUS

## 2019-04-26 MED ORDER — ONDANSETRON HCL 4 MG/2ML IJ SOLN
4.0000 mg | Freq: Four times a day (QID) | INTRAMUSCULAR | Status: DC | PRN
  Administered 2019-04-27 – 2019-04-28 (×2): 4 mg via INTRAVENOUS
  Filled 2019-04-26 (×2): qty 2

## 2019-04-26 MED ORDER — TRAMADOL HCL 50 MG PO TABS
50.0000 mg | ORAL_TABLET | Freq: Four times a day (QID) | ORAL | Status: DC | PRN
  Administered 2019-04-26 – 2019-04-29 (×7): 50 mg via ORAL
  Filled 2019-04-26 (×7): qty 1

## 2019-04-26 MED ORDER — ACETAMINOPHEN 500 MG PO TABS
ORAL_TABLET | ORAL | Status: AC
  Administered 2019-04-26: 09:00:00 1000 mg via ORAL
  Filled 2019-04-26: qty 2

## 2019-04-26 MED ORDER — MIDAZOLAM HCL 2 MG/2ML IJ SOLN
INTRAMUSCULAR | Status: AC
  Filled 2019-04-26: qty 2

## 2019-04-26 MED ORDER — 0.9 % SODIUM CHLORIDE (POUR BTL) OPTIME
TOPICAL | Status: DC | PRN
  Administered 2019-04-26 (×2): 1000 mL

## 2019-04-26 MED ORDER — GABAPENTIN 300 MG PO CAPS
300.0000 mg | ORAL_CAPSULE | Freq: Two times a day (BID) | ORAL | Status: DC
  Administered 2019-04-26 – 2019-04-29 (×7): 300 mg via ORAL
  Filled 2019-04-26 (×7): qty 1

## 2019-04-26 MED ORDER — KETOROLAC TROMETHAMINE 30 MG/ML IJ SOLN
INTRAMUSCULAR | Status: AC
  Filled 2019-04-26: qty 1

## 2019-04-26 MED ORDER — MIDAZOLAM HCL 5 MG/5ML IJ SOLN
INTRAMUSCULAR | Status: DC | PRN
  Administered 2019-04-26: 2 mg via INTRAVENOUS

## 2019-04-26 MED ORDER — LIDOCAINE 2% (20 MG/ML) 5 ML SYRINGE
INTRAMUSCULAR | Status: DC | PRN
  Administered 2019-04-26: 100 mg via INTRAVENOUS

## 2019-04-26 MED ORDER — LACTATED RINGERS IV SOLN: INTRAVENOUS | Status: DC

## 2019-04-26 MED ORDER — FENTANYL CITRATE (PF) 100 MCG/2ML IJ SOLN
INTRAMUSCULAR | Status: DC | PRN
  Administered 2019-04-26: 100 ug via INTRAVENOUS
  Administered 2019-04-26: 150 ug via INTRAVENOUS
  Administered 2019-04-26: 50 ug via INTRAVENOUS
  Administered 2019-04-26: 100 ug via INTRAVENOUS

## 2019-04-26 SURGICAL SUPPLY — 51 items
APL PRP STRL LF DISP 70% ISPRP (MISCELLANEOUS) ×1
BINDER ABDOMINAL 12 ML 46-62 (SOFTGOODS) ×1 IMPLANT
BLADE CLIPPER SURG (BLADE) ×1 IMPLANT
BLADE SURG 11 STRL SS (BLADE) ×2 IMPLANT
CANISTER SUCT 3000ML PPV (MISCELLANEOUS) ×2 IMPLANT
CHLORAPREP W/TINT 26 (MISCELLANEOUS) ×2 IMPLANT
COVER SURGICAL LIGHT HANDLE (MISCELLANEOUS) ×2 IMPLANT
DRAPE INCISE IOBAN 66X45 STRL (DRAPES) ×1 IMPLANT
DRAPE LAPAROSCOPIC ABDOMINAL (DRAPES) ×2 IMPLANT
DRAPE WARM FLUID 44X44 (DRAPES) ×2 IMPLANT
DRSG OPSITE POSTOP 4X12 (GAUZE/BANDAGES/DRESSINGS) ×1 IMPLANT
ELECT CAUTERY BLADE 6.4 (BLADE) ×2 IMPLANT
ELECT REM PT RETURN 9FT ADLT (ELECTROSURGICAL) ×2
ELECTRODE REM PT RTRN 9FT ADLT (ELECTROSURGICAL) ×1 IMPLANT
GLOVE BIO SURGEON STRL SZ7 (GLOVE) ×2 IMPLANT
GLOVE BIO SURGEON STRL SZ7.5 (GLOVE) ×2 IMPLANT
GLOVE BIOGEL PI IND STRL 6.5 (GLOVE) IMPLANT
GLOVE BIOGEL PI IND STRL 7.0 (GLOVE) IMPLANT
GLOVE BIOGEL PI IND STRL 7.5 (GLOVE) IMPLANT
GLOVE BIOGEL PI IND STRL 8 (GLOVE) ×1 IMPLANT
GLOVE BIOGEL PI INDICATOR 6.5 (GLOVE) ×2
GLOVE BIOGEL PI INDICATOR 7.0 (GLOVE) ×2
GLOVE BIOGEL PI INDICATOR 7.5 (GLOVE) ×1
GLOVE BIOGEL PI INDICATOR 8 (GLOVE) ×1
GLOVE ECLIPSE 7.0 STRL STRAW (GLOVE) ×1 IMPLANT
GOWN STRL REUS W/ TWL LRG LVL3 (GOWN DISPOSABLE) ×1 IMPLANT
GOWN STRL REUS W/ TWL XL LVL3 (GOWN DISPOSABLE) ×1 IMPLANT
GOWN STRL REUS W/TWL LRG LVL3 (GOWN DISPOSABLE) ×6
GOWN STRL REUS W/TWL XL LVL3 (GOWN DISPOSABLE) ×2
HANDLE SUCTION POOLE (INSTRUMENTS) ×1 IMPLANT
KIT BASIN OR (CUSTOM PROCEDURE TRAY) ×2 IMPLANT
KIT TURNOVER KIT B (KITS) ×2 IMPLANT
MESH VENTRALIGHT ST 10X13IN (Mesh General) ×1 IMPLANT
NS IRRIG 1000ML POUR BTL (IV SOLUTION) ×4 IMPLANT
PACK GENERAL/GYN (CUSTOM PROCEDURE TRAY) ×2 IMPLANT
PAD ABD 8X10 STRL (GAUZE/BANDAGES/DRESSINGS) ×2 IMPLANT
PAD ARMBOARD 7.5X6 YLW CONV (MISCELLANEOUS) ×4 IMPLANT
PENCIL SMOKE EVACUATOR (MISCELLANEOUS) ×2 IMPLANT
RETAINER VISCERA MED (MISCELLANEOUS) ×1 IMPLANT
SPONGE LAP 18X18 X RAY DECT (DISPOSABLE) ×1 IMPLANT
STAPLER VISISTAT 35W (STAPLE) ×1 IMPLANT
SUCTION POOLE HANDLE (INSTRUMENTS) ×2
SUT NOVA NAB GS-21 0 18 T12 DT (SUTURE) ×3 IMPLANT
SUT PDS AB 1 TP1 54 (SUTURE) ×10 IMPLANT
SUT SILK 2 0 TIES 10X30 (SUTURE) ×1 IMPLANT
SUT VIC AB 3-0 SH 18 (SUTURE) ×1 IMPLANT
SUT VIC AB 3-0 SH 8-18 (SUTURE) ×1 IMPLANT
TOWEL GREEN STERILE (TOWEL DISPOSABLE) ×2 IMPLANT
TOWEL GREEN STERILE FF (TOWEL DISPOSABLE) ×2 IMPLANT
TRAY FOLEY MTR SLVR 16FR STAT (SET/KITS/TRAYS/PACK) ×2 IMPLANT
WATER STERILE IRR 1000ML POUR (IV SOLUTION) ×2 IMPLANT

## 2019-04-26 NOTE — Anesthesia Preprocedure Evaluation (Signed)
Anesthesia Evaluation  Patient identified by MRN, date of birth, ID band Patient awake    Reviewed: Allergy & Precautions, NPO status , Patient's Chart, lab work & pertinent test results  Airway Mallampati: II  TM Distance: >3 FB Neck ROM: Full    Dental no notable dental hx.    Pulmonary sleep apnea , former smoker,    Pulmonary exam normal breath sounds clear to auscultation       Cardiovascular negative cardio ROS Normal cardiovascular exam Rhythm:Regular Rate:Normal     Neuro/Psych negative neurological ROS  negative psych ROS   GI/Hepatic negative GI ROS, Neg liver ROS,   Endo/Other  negative endocrine ROS  Renal/GU negative Renal ROS  negative genitourinary   Musculoskeletal negative musculoskeletal ROS (+)   Abdominal (+) + obese,   Peds negative pediatric ROS (+)  Hematology negative hematology ROS (+)   Anesthesia Other Findings   Reproductive/Obstetrics negative OB ROS                             Anesthesia Physical Anesthesia Plan  ASA: II  Anesthesia Plan: General   Post-op Pain Management:    Induction: Intravenous  PONV Risk Score and Plan: 2 and Ondansetron, Midazolam and Treatment may vary due to age or medical condition  Airway Management Planned: Oral ETT  Additional Equipment:   Intra-op Plan:   Post-operative Plan: Extubation in OR  Informed Consent: I have reviewed the patients History and Physical, chart, labs and discussed the procedure including the risks, benefits and alternatives for the proposed anesthesia with the patient or authorized representative who has indicated his/her understanding and acceptance.     Dental advisory given  Plan Discussed with: CRNA  Anesthesia Plan Comments:         Anesthesia Quick Evaluation

## 2019-04-26 NOTE — Anesthesia Postprocedure Evaluation (Signed)
Anesthesia Post Note  Patient: Jacob Page  Procedure(s) Performed: EXPLORATORY LAPAROTOMY (N/A Abdomen) INCISIONAL HERNIA REPAIR WITH MESH (TAR) (N/A Abdomen) LYSIS OF ADHESION (N/A Abdomen) Insertion Of Mesh (N/A Abdomen)     Patient location during evaluation: PACU Anesthesia Type: General Level of consciousness: awake Pain management: pain level controlled Vital Signs Assessment: post-procedure vital signs reviewed and stable Respiratory status: spontaneous breathing, nonlabored ventilation, respiratory function stable and patient connected to nasal cannula oxygen Cardiovascular status: blood pressure returned to baseline and stable Postop Assessment: no apparent nausea or vomiting Anesthetic complications: no    Last Vitals:  Vitals:   04/26/19 1400 04/26/19 1440  BP: 140/80 135/82  Pulse: 65 64  Resp: 12 15  Temp: 36.7 C 36.9 C  SpO2: 96% 93%    Last Pain:  Vitals:   04/26/19 1440  TempSrc: Oral  PainSc:                  Jana Swartzlander P Jaquetta Currier

## 2019-04-26 NOTE — Op Note (Signed)
04/26/2019  12:19 PM  PATIENT:  Jacob Page  64 y.o. male  PRE-OPERATIVE DIAGNOSIS:  INCISIONAL HERNIA  POST-OPERATIVE DIAGNOSIS:  INCISIONAL HERNIA  PROCEDURE:  Procedure(s): EXPLORATORY LAPAROTOMY (N/A) INCISIONAL HERNIA REPAIR WITH MESH (TAR) WITH BILATERAL MUSCULOCUTANEOUS ADVANCEMENT FLAPS (N/A) LYSIS OF ADHESION (N/A) x30 minutes Insertion Of Mesh (N/A)  SURGEON:  Surgeon(s) and Role:    Axel Filler, MD - Primary    * Darleene Cleaver, MD - Assisting-who is crucial in assisting with retraction, identification of the abdominal wall anatomy, as well as placement of mesh and closure of the abdominal wall.  ANESTHESIA:   local and general  EBL:  50 mL   BLOOD ADMINISTERED:none  DRAINS: none   LOCAL MEDICATIONS USED:  none  SPECIMEN:  No Specimen  DISPOSITION OF SPECIMEN:  N/A  COUNTS:  YES  TOURNIQUET:  * No tourniquets in log *  DICTATION: .Dragon Dictation Indication procedure: Patient is a 65 year old male who previously underwent left laparoscopic inguinal hernia repair with mesh.  Patient subsequently underwent right robotic inguinal hernia repair with mesh.  Due to complications with bleeding patient underwent exploratory laparotomy at that operation.  Patient was seen in my clinic secondary to a ventral incisional hernia.  Secondary to pain discomfort and increasing hernia size patient said he had this repaired.  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  A piece of 33 x 25 cm Bard ST light mesh was placed into the retrorectus space after bilateral musculocutaneous advancement flaps were done bilaterally in the transversus abdominis release fashion.  This mesh was secured laterally, bilaterally using 0 Novafil's, as well as 2 superiorly and inferiorly.  Details of procedure: After the patient was consented he was taken back to the operating room placed supine position bilateral SCDs in place. After appropriate antibiotics were  confirmed timeout was called and all facts verified.  The superficial scar was dissected off the anterior abdominal wall. This was discarded.  Bovie cautery was used to maintain hemostasis and dissection was taken down to the anterior fascia midline. This was incised. The fascia was elevated up. The hernia sac was then bluntly penetrated. At this the incision was extended  inferiorly. There were minimal adhesions to the hernia sac in the midline. This allowed [place] to extend the incision inferiorly to the extent of the hernia as well as superiorly.  At this time proceeded to lyse adhesions of the omentum to the anterior abdominal wall.  Adhesiolysis was approximately 30 minutes.  Once this was done the rectus sheath retracted medially. The inferior portion of the left tissues were then incised with electrocautery. The muscle was adherent to the posterior rectus sheath, However I was able to dissect off the posterior rectus sheath in its entirety.  At this time a proximal 1 cm medial to the perforating vessels the transversus abdominis muscle was incised as was its fascia. We carried this dissection both inferiorly and superiorly. At this time the transversus abdominis muscle was then dissected laterally in a blunt fashion. A right angle was used to initially started the dissection with electrocautery. This easily dissected laterally. This allowed advancement of the peritoneum medially.  The dissection was carried superiorly towards the fibers of the diaphragm.  This was incised up towards the central tendon of the diaphragm.  At this time proceeded to retract the right rectus sheath medially and the inferior portion of the rectus sheath was incised. There were minimal muscular adhesions in this area. This  easily dissected away.  Inferiorly the rectus muscles were intimately associated with the posterior rectus fascia.  This was likely due to the previous operation and robotic repair in the preperitoneal mesh.   Should be noted that inferiorly bilateral inguinal preperitoneal meshes could easily be palpated.    Again approximately 1 cm medial to the perforating vessels to the transversus abdominis muscle was incised as was its fascia. Again we proceeded to dissect with a right angle both superiorly and inferiorly to release the transversus abdominis muscle. At this time proceeded to bluntly dissect away the transverse abdominis muscle from the underlying peritoneum laterally.  The superior and inferior portions of the dissection were connected in this plane. This allowed [place] to bluntly dissect the preperitoneal space superior and inferiorly. This was carried approximately to 6 cm superior inferiorly. At this time using Allis clamps and peritoneum was brought to the midline. This was easily approximated in the midline. At this time 0 PDS suture was used in a running standard fashion reapproximate the peritoneum in the midline.  There was no tension on the midline peritoneal closure.  At this time a piece of 33x25 cm Bard ST light mesh was placed in its entirety into the preperitoneal space. The Novafil sutures were used to tack the mesh. Fascial using Endo Close device 3 laterally, and bilaterally. This was also done superior and inferior portion of the hernia 2 respectively. The mesh lay flat and the tension was called laterally on each side. At this time as it was used to approximate the mesh to the peritoneum.   At this time the rectus fascia was reapproximated midline using 0 PDS in a standard running fashion. Again there was no tension on the fascia in the midline.  The skin was reapproximated using skin staples, and a honeycomb dressing.  Patient was awakened from general anesthesia and taken to recovery room stable condition.     PLAN OF CARE: Admit for overnight observation  PATIENT DISPOSITION:  PACU - hemodynamically stable.   Delay start of Pharmacological VTE agent (>24hrs) due to surgical  blood loss or risk of bleeding: yes

## 2019-04-26 NOTE — Interval H&P Note (Signed)
History and Physical Interval Note:  04/26/2019 9:04 AM  Jacob Page  has presented today for surgery, with the diagnosis of INCISIONAL HERNIA.  The various methods of treatment have been discussed with the patient and family. After consideration of risks, benefits and other options for treatment, the patient has consented to  Procedure(s): EXPLORATORY LAPAROTOMY (N/A) INCISIONAL HERNIA REPAIR WITH MESH (TAR) (N/A) LYSIS OF ADHESION (N/A) as a surgical intervention.  The patient's history has been reviewed, patient examined, no change in status, stable for surgery.  I have reviewed the patient's chart and labs.  Questions were answered to the patient's satisfaction.     Axel Filler

## 2019-04-26 NOTE — Anesthesia Procedure Notes (Signed)
Procedure Name: Intubation Date/Time: 04/26/2019 9:34 AM Performed by: Babs Bertin, CRNA Pre-anesthesia Checklist: Patient identified, Emergency Drugs available, Suction available and Patient being monitored Patient Re-evaluated:Patient Re-evaluated prior to induction Oxygen Delivery Method: Circle System Utilized Preoxygenation: Pre-oxygenation with 100% oxygen Induction Type: IV induction and Rapid sequence Laryngoscope Size: Mac and 4 Grade View: Grade I Tube type: Oral Tube size: 7.5 mm Number of attempts: 1 Airway Equipment and Method: Stylet and Oral airway Placement Confirmation: ETT inserted through vocal cords under direct vision,  positive ETCO2 and breath sounds checked- equal and bilateral Secured at: 23 cm Tube secured with: Tape Dental Injury: Teeth and Oropharynx as per pre-operative assessment

## 2019-04-26 NOTE — Transfer of Care (Signed)
Immediate Anesthesia Transfer of Care Note  Patient: LANGFORD CARIAS  Procedure(s) Performed: EXPLORATORY LAPAROTOMY (N/A Abdomen) INCISIONAL HERNIA REPAIR WITH MESH (TAR) (N/A Abdomen) LYSIS OF ADHESION (N/A Abdomen) Insertion Of Mesh (N/A Abdomen)  Patient Location: PACU  Anesthesia Type:General  Level of Consciousness: awake, alert  and oriented  Airway & Oxygen Therapy: Patient Spontanous Breathing and Patient connected to nasal cannula oxygen  Post-op Assessment: Report given to RN and Post -op Vital signs reviewed and stable  Post vital signs: Reviewed and stable  Last Vitals:  Vitals Value Taken Time  BP 129/101 04/26/19 1235  Temp 36.6 C 04/26/19 1235  Pulse 78 04/26/19 1235  Resp 15 04/26/19 1235  SpO2 99 % 04/26/19 1235  Vitals shown include unvalidated device data.  Last Pain:  Vitals:   04/26/19 0854  TempSrc:   PainSc: 0-No pain         Complications: No apparent anesthesia complications

## 2019-04-27 ENCOUNTER — Encounter: Payer: Self-pay | Admitting: *Deleted

## 2019-04-27 LAB — CBC
HCT: 42.3 % (ref 39.0–52.0)
Hemoglobin: 13.9 g/dL (ref 13.0–17.0)
MCH: 28.7 pg (ref 26.0–34.0)
MCHC: 32.9 g/dL (ref 30.0–36.0)
MCV: 87.4 fL (ref 80.0–100.0)
Platelets: 237 10*3/uL (ref 150–400)
RBC: 4.84 MIL/uL (ref 4.22–5.81)
RDW: 13.5 % (ref 11.5–15.5)
WBC: 15.2 10*3/uL — ABNORMAL HIGH (ref 4.0–10.5)
nRBC: 0 % (ref 0.0–0.2)

## 2019-04-27 MED ORDER — DOCUSATE SODIUM 100 MG PO CAPS
100.0000 mg | ORAL_CAPSULE | Freq: Two times a day (BID) | ORAL | Status: DC
  Administered 2019-04-27 – 2019-04-29 (×4): 100 mg via ORAL
  Filled 2019-04-27 (×5): qty 1

## 2019-04-27 MED ORDER — ACYCLOVIR 5 % EX OINT
TOPICAL_OINTMENT | Freq: Three times a day (TID) | CUTANEOUS | Status: DC | PRN
  Filled 2019-04-27: qty 15

## 2019-04-27 NOTE — Plan of Care (Signed)
  Problem: Education: Goal: Knowledge of General Education information will improve Description Including pain rating scale, medication(s)/side effects and non-pharmacologic comfort measures Outcome: Progressing   

## 2019-04-27 NOTE — Plan of Care (Signed)
  Problem: Coping: Goal: Level of anxiety will decrease Outcome: Progressing   Problem: Pain Managment: Goal: General experience of comfort will improve Outcome: Progressing   Problem: Safety: Goal: Ability to remain free from injury will improve Outcome: Progressing   Problem: Skin Integrity: Goal: Risk for impaired skin integrity will decrease Outcome: Progressing   

## 2019-04-27 NOTE — Progress Notes (Signed)
1 Day Post-Op   Subjective/Chief Complaint: Pt with pain overnight   Objective: Vital signs in last 24 hours: Temp:  [97.7 F (36.5 C)-98.4 F (36.9 C)] 97.7 F (36.5 C) (04/08 0459) Pulse Rate:  [59-79] 59 (04/08 0459) Resp:  [10-20] 16 (04/08 0459) BP: (109-140)/(72-101) 129/81 (04/08 0459) SpO2:  [93 %-100 %] 100 % (04/08 0459) Weight:  [117.9 kg] 117.9 kg (04/07 0828) Last BM Date: 04/25/19  Intake/Output from previous day: 04/07 0701 - 04/08 0700 In: 3885.4 [P.O.:540; I.V.:3095.4; IV Piggyback:50] Out: 1180 [Urine:1130; Blood:50] Intake/Output this shift: No intake/output data recorded.  Incision/Wound: inc c/d/i   Lab Results:  Recent Labs    04/27/19 0514  WBC 15.2*  HGB 13.9  HCT 42.3  PLT 237   Anti-infectives: Anti-infectives (From admission, onward)   Start     Dose/Rate Route Frequency Ordered Stop   04/26/19 0847  ceFAZolin (ANCEF) 2-4 GM/100ML-% IVPB    Note to Pharmacy: Clydene Laming   : cabinet override      04/26/19 0847 04/26/19 2059   04/26/19 0600  ceFAZolin (ANCEF) 3 g in dextrose 5 % 50 mL IVPB     3 g 100 mL/hr over 30 Minutes Intravenous On call to O.R. 04/25/19 0742 04/26/19 1303      Assessment/Plan: s/p Procedure(s): EXPLORATORY LAPAROTOMY (N/A) INCISIONAL HERNIA REPAIR WITH MESH (TAR) (N/A) LYSIS OF ADHESION (N/A) Insertion Of Mesh (N/A) d/c foley  Mobilize Adv diet as tol Pain control Plan for home Fri  LOS: 1 day    Axel Filler 04/27/2019

## 2019-04-28 DIAGNOSIS — K432 Incisional hernia without obstruction or gangrene: Secondary | ICD-10-CM | POA: Diagnosis present

## 2019-04-28 DIAGNOSIS — Z87891 Personal history of nicotine dependence: Secondary | ICD-10-CM | POA: Diagnosis not present

## 2019-04-28 DIAGNOSIS — K660 Peritoneal adhesions (postprocedural) (postinfection): Secondary | ICD-10-CM | POA: Diagnosis present

## 2019-04-28 DIAGNOSIS — Z833 Family history of diabetes mellitus: Secondary | ICD-10-CM | POA: Diagnosis not present

## 2019-04-28 DIAGNOSIS — G4730 Sleep apnea, unspecified: Secondary | ICD-10-CM | POA: Diagnosis present

## 2019-04-28 DIAGNOSIS — J45909 Unspecified asthma, uncomplicated: Secondary | ICD-10-CM | POA: Diagnosis present

## 2019-04-28 DIAGNOSIS — M62838 Other muscle spasm: Secondary | ICD-10-CM | POA: Diagnosis not present

## 2019-04-28 DIAGNOSIS — Z8261 Family history of arthritis: Secondary | ICD-10-CM | POA: Diagnosis not present

## 2019-04-28 MED ORDER — PANTOPRAZOLE SODIUM 40 MG PO TBEC
40.0000 mg | DELAYED_RELEASE_TABLET | Freq: Two times a day (BID) | ORAL | Status: DC
  Administered 2019-04-28 – 2019-04-29 (×3): 40 mg via ORAL
  Filled 2019-04-28 (×3): qty 1

## 2019-04-28 MED ORDER — DIAZEPAM 2 MG PO TABS
5.0000 mg | ORAL_TABLET | Freq: Four times a day (QID) | ORAL | Status: DC | PRN
  Administered 2019-04-28 (×2): 5 mg via ORAL
  Filled 2019-04-28 (×2): qty 3

## 2019-04-28 NOTE — Progress Notes (Signed)
2 Days Post-Op   Subjective/Chief Complaint: Pt doing. C/o pain overnight.  Seems like spasms Didn't ambulate yesterday   Objective: Vital signs in last 24 hours: Temp:  [98.2 F (36.8 C)-99.3 F (37.4 C)] 98.2 F (36.8 C) (04/09 0504) Pulse Rate:  [72-83] 83 (04/09 0504) Resp:  [17-18] 17 (04/09 0504) BP: (115-137)/(71-84) 137/84 (04/09 0504) SpO2:  [93 %] 93 % (04/09 0504) Last BM Date: 04/25/19  Intake/Output from previous day: 04/08 0701 - 04/09 0700 In: 2113.1 [I.V.:1613.1; IV Piggyback:500] Out: 750 [Urine:750] Intake/Output this shift: No intake/output data recorded.  GI: inc c/d/i  Lab Results:  Recent Labs    04/27/19 0514  WBC 15.2*  HGB 13.9  HCT 42.3  PLT 237  Studies/Results: No results found.  Anti-infectives: Anti-infectives (From admission, onward)   Start     Dose/Rate Route Frequency Ordered Stop   04/26/19 0847  ceFAZolin (ANCEF) 2-4 GM/100ML-% IVPB    Note to Pharmacy: Clydene Laming   : cabinet override      04/26/19 0847 04/26/19 2059   04/26/19 0600  ceFAZolin (ANCEF) 3 g in dextrose 5 % 50 mL IVPB     3 g 100 mL/hr over 30 Minutes Intravenous On call to O.R. 04/25/19 3614 04/26/19 1303      Assessment/Plan: s/p Procedure(s): EXPLORATORY LAPAROTOMY (N/A) INCISIONAL HERNIA REPAIR WITH MESH (TAR) (N/A) LYSIS OF ADHESION (N/A) Insertion Of Mesh (N/A) -mobilize -valium for muscle spasms -PT eval -home over the weekend  LOS: 1 day    Axel Filler 04/28/2019

## 2019-04-28 NOTE — Discharge Instructions (Signed)
[facility] _______[facility] ° °HERNIA REPAIR: POST OP INSTRUCTIONS ° °Always review your discharge instruction sheet given to you by the facility where your surgery was performed. °IF YOU HAVE DISABILITY OR FAMILY LEAVE FORMS, YOU MUST BRING THEM TO THE OFFICE FOR PROCESSING.   °DO NOT GIVE THEM TO YOUR DOCTOR. ° °1. A  prescription for pain medication may be given to you upon discharge.  Take your pain medication as prescribed, if needed.  If narcotic pain medicine is not needed, then you may take acetaminophen (Tylenol) or ibuprofen (Advil) as needed. °2. Take your usually prescribed medications unless otherwise directed. °If you need a refill on your pain medication, please contact your pharmacy.  They will contact our office to request authorization. Prescriptions will not be filled after 5 pm or on week-ends. °3. You should follow a light diet the first 24 hours after arrival home, such as soup and crackers, etc.  Be sure to include lots of fluids daily.  Resume your normal diet the day after surgery. °4.Most patients will experience some swelling and bruising around the umbilicus or in the groin and scrotum.  Ice packs and reclining will help.  Swelling and bruising can take several days to resolve.  °6. It is common to experience some constipation if taking pain medication after surgery.  Increasing fluid intake and taking a stool softener (such as Colace) will usually help or prevent this problem from occurring.  A mild laxative (Milk of Magnesia or Miralax) should be taken according to package directions if there are no bowel movements after 48 hours. °7. Unless discharge instructions indicate otherwise, you may remove your bandages 24-48 hours after surgery, and you may shower at that time.  You may have steri-strips (small skin tapes) in place directly over the incision.  These strips should be left on the skin for 7-10 days.  If your surgeon used skin glue on the incision, you may shower in  24 hours.  The glue will flake off over the next 2-3 weeks.  Any sutures or staples will be removed at the office during your follow-up visit. °8. ACTIVITIES:  You may resume regular (light) daily activities beginning the next day--such as daily self-care, walking, climbing stairs--gradually increasing activities as tolerated.  You may have sexual intercourse when it is comfortable.  Refrain from any heavy lifting or straining until approved by your doctor. ° °a.You may drive when you are no longer taking prescription pain medication, you can comfortably wear a seatbelt, and you can safely maneuver your car and apply brakes. °b.RETURN TO WORK:   °_____________________________________________ ° °9.You should see your doctor in the office for a follow-up appointment approximately 2-3 weeks after your surgery.  Make sure that you call for this appointment within a day or two after you arrive home to insure a convenient appointment time. °10.OTHER INSTRUCTIONS: _________________________ °   _____________________________________ ° °WHEN TO CALL YOUR DOCTOR: °1. Fever over 101.0 °2. Inability to urinate °3. Nausea and/or vomiting °4. Extreme swelling or bruising °5. Continued bleeding from incision. °6. Increased pain, redness, or drainage from the incision ° °The clinic staff is available to answer your questions during regular business hours.  Please don’t hesitate to call and ask to speak to one of the nurses for clinical concerns.  If you have a medical emergency, go to the nearest emergency room or call 911.  A surgeon from [facility] is always on call at the hospital ° ° °[address], [address], [place], [place]  [address] ? ° [address] [address], [place], [place]   [address] °[phone] ? [phone] ? FAX [phone] °Web site: [email or web address] ° °

## 2019-04-28 NOTE — Progress Notes (Signed)
PT Cancellation Note  Patient Details Name: Jacob Page MRN: [id number] DOB: October 20, 1955   Cancelled Treatment:    Reason Eval/Treat Not Completed: Pain limiting ability to participate Pt reports he ambulated in hallway earlier this morning and refusing therapy evaluation. Requesting pain medication and RN notified. Encouraged increased mobilization, progressing to 2-3 walks a day.    Lillia Pauls, PT, DPT Acute Rehabilitation Services Pager [phone] Office [phone]    Norval Morton 04/28/2019, 3:09 PM

## 2019-04-28 NOTE — Progress Notes (Signed)
Pt c/o of severe pain. PRN toradol, ultram and dilaudid given with minimal relief. Offered prn Maalox, pt refused for it makes his stomach sick. Offered to ambulate pt multiple times but pt stated. "I hurt too much, I cant move." Made pt know when is the next prn meds available. Will continue to monitor pt.

## 2019-04-29 MED ORDER — TRAMADOL HCL 50 MG PO TABS: 50.0000 mg | ORAL_TABLET | Freq: Four times a day (QID) | ORAL | 0 refills | Status: DC | PRN

## 2019-04-29 MED ORDER — ALBUTEROL SULFATE (2.5 MG/3ML) 0.083% IN NEBU
2.5000 mg | INHALATION_SOLUTION | Freq: Four times a day (QID) | RESPIRATORY_TRACT | Status: DC | PRN
  Administered 2019-04-29: 05:00:00 2.5 mg via RESPIRATORY_TRACT
  Filled 2019-04-29: qty 3

## 2019-04-29 MED ORDER — HYDROCODONE-ACETAMINOPHEN 5-325 MG PO TABS: 1.0000 | ORAL_TABLET | Freq: Four times a day (QID) | ORAL | 0 refills | Status: DC | PRN

## 2019-04-29 NOTE — Progress Notes (Addendum)
Pt c/o of shortness of breath. VS as follows: T=99.1 orally,P=91bpm, BP=151/99 with MAP of 112, RR=21. Wheezing through out the lungs. Pt able to pull 1250 in IS. Made Dr. Cliffton Asters aware. DC'd IVF, and albuterol prn q6h ordered.

## 2019-04-29 NOTE — Plan of Care (Signed)
Ready for discharge

## 2019-04-29 NOTE — Progress Notes (Signed)
3 Days Post-Op   Subjective/Chief Complaint: Feeling much better. Passing flatus. No SOB   Objective: Vital signs in last 24 hours: Temp:  [98 F (36.7 C)-99.9 F (37.7 C)] 99.1 F (37.3 C) (04/10 0436) Pulse Rate:  [73-91] 91 (04/10 0436) Resp:  [16-19] 19 (04/10 0436) BP: (127-151)/(78-99) 151/99 (04/10 0436) SpO2:  [92 %-96 %] 92 % (04/10 0436) Last BM Date: 04/25/19  Intake/Output from previous day: 04/09 0701 - 04/10 0700 In: 3717 [I.V.:3717] Out: 300 [Urine:300] Intake/Output this shift: Total I/O In: -  Out: 400 [Urine:400]  General appearance: alert and cooperative Resp: clear to auscultation bilaterally Cardio: regular rate and rhythm GI: soft, mild tenderness. incision ok with mild bruising  Lab Results:  Recent Labs    04/27/19 0514  WBC 15.2*  HGB 13.9  HCT 42.3  PLT 237   BMET No results for input(s): NA, K, CL, CO2, GLUCOSE, BUN, CREATININE, CALCIUM in the last 72 hours. PT/INR No results for input(s): LABPROT, INR in the last 72 hours. ABG No results for input(s): PHART, HCO3 in the last 72 hours.  Invalid input(s): PCO2, PO2  Studies/Results: No results found.  Anti-infectives: Anti-infectives (From admission, onward)   Start     Dose/Rate Route Frequency Ordered Stop   04/26/19 0847  ceFAZolin (ANCEF) 2-4 GM/100ML-% IVPB    Note to Pharmacy: Clydene Laming   : cabinet override      04/26/19 0847 04/26/19 2059   04/26/19 0600  ceFAZolin (ANCEF) 3 g in dextrose 5 % 50 mL IVPB     3 g 100 mL/hr over 30 Minutes Intravenous On call to O.R. 04/25/19 0742 04/26/19 1303      Assessment/Plan: s/p Procedure(s): EXPLORATORY LAPAROTOMY (N/A) INCISIONAL HERNIA REPAIR WITH MESH (TAR) (N/A) LYSIS OF ADHESION (N/A) Insertion Of Mesh (N/A) Advance diet Discharge  LOS: 2 days    Chevis Pretty III 04/29/2019

## 2019-05-05 NOTE — Discharge Summary (Addendum)
[facility] Discharge Summary   Patient ID: Jacob Page MRN: [id number] DOB/AGE: [birth year] 64 y.o.  Admit date: 04/26/2019 Discharge date: 05/05/2019  Discharge Diagnosis Patient Active Problem List   Diagnosis Date Noted  . S/P hernia repair 04/26/2019   Consultants None   Imaging: No results found.  Procedures Dr. Axel Filler (04/26/19)   HPI:  Patient is a 64 year old male who previously had undergone a laparoscopic left inguinal hernia repair with mesh and laparoscopic umbilical hernia repair with mesh, in 2017. Patient subsequently had a attempted right robotic inguinal hernia repair which resulted in bleeding from the epigastric vessel that required clipping. This also required exploratory laparotomy via a large midline incision. This is done in 2019. Patient states that approximately 6 months after surgery he noticed a bulge upper epigastric region. He states that since that time the bulge is getting larger. He states he does have some discomfort and pain to both hernias. Patient recently underwent CT scan which I reviewed personally. 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 Patient had no signs or symptoms of incarceration or strangulation. He presents to the hospital for elective hernia repair.  Hospital Course:  Patient underwent procedure listed above.  Tolerated procedure well and was transferred to the floor. On POD#1 foley was removed. Diet was advanced as tolerated.  On POD#3, the patient was voiding well, tolerating diet, ambulating well, pain well controlled, vital signs stable, incisions c/d/i and felt stable for discharge home.  Patient will follow up in our office and knows to call with questions or concerns.  He will call to confirm appointment date/time.    I have never personally evaluated this patient, the above summary was created entirely from chart  review.  Allergies as of 04/29/2019   No Known Allergies     Medication List    TAKE these medications   HYDROcodone-acetaminophen 5-325 MG tablet Commonly known as: NORCO/VICODIN Take 1-2 tablets by mouth every 6 (six) hours as needed for moderate pain or severe pain.   traMADol 50 MG tablet Commonly known as: ULTRAM Take 1 tablet (50 mg total) by mouth every 6 (six) hours as needed (mild pain).        Follow-up Information    Axel Filler, MD. Schedule an appointment as soon as possible for a visit in 3 week(s).   Specialty: General Surgery Contact information: [address] [place] [address] [phone]           Signed: Hosie Spangle, [facility] 05/05/2019, 11:41 AM

## 2019-08-28 ENCOUNTER — Other Ambulatory Visit: Payer: Self-pay

## 2019-08-28 ENCOUNTER — Encounter (HOSPITAL_COMMUNITY): Payer: Self-pay

## 2019-08-28 ENCOUNTER — Emergency Department (HOSPITAL_COMMUNITY): Payer: 59

## 2019-08-28 ENCOUNTER — Inpatient Hospital Stay (HOSPITAL_COMMUNITY)
Admission: EM | Admit: 2019-08-28 | Discharge: 2019-09-08 | DRG: 177 | Disposition: A | Discharge status: Home Health | Payer: 59 | Attending: Internal Medicine | Admitting: Internal Medicine

## 2019-08-28 DIAGNOSIS — J440 Chronic obstructive pulmonary disease with acute lower respiratory infection: Secondary | ICD-10-CM | POA: Diagnosis present

## 2019-08-28 DIAGNOSIS — G4733 Obstructive sleep apnea (adult) (pediatric): Secondary | ICD-10-CM | POA: Diagnosis not present

## 2019-08-28 DIAGNOSIS — U071 COVID-19: Secondary | ICD-10-CM | POA: Diagnosis present

## 2019-08-28 DIAGNOSIS — J1282 Pneumonia due to coronavirus disease 2019: Secondary | ICD-10-CM | POA: Diagnosis not present

## 2019-08-28 DIAGNOSIS — E860 Dehydration: Secondary | ICD-10-CM | POA: Diagnosis present

## 2019-08-28 DIAGNOSIS — R0602 Shortness of breath: Secondary | ICD-10-CM

## 2019-08-28 DIAGNOSIS — Z6832 Body mass index (BMI) 32.0-32.9, adult: Secondary | ICD-10-CM | POA: Diagnosis not present

## 2019-08-28 DIAGNOSIS — R739 Hyperglycemia, unspecified: Secondary | ICD-10-CM | POA: Diagnosis present

## 2019-08-28 DIAGNOSIS — J9621 Acute and chronic respiratory failure with hypoxia: Secondary | ICD-10-CM | POA: Diagnosis not present

## 2019-08-28 DIAGNOSIS — R55 Syncope and collapse: Secondary | ICD-10-CM | POA: Diagnosis not present

## 2019-08-28 DIAGNOSIS — E876 Hypokalemia: Secondary | ICD-10-CM | POA: Diagnosis present

## 2019-08-28 DIAGNOSIS — J189 Pneumonia, unspecified organism: Secondary | ICD-10-CM

## 2019-08-28 DIAGNOSIS — Z87891 Personal history of nicotine dependence: Secondary | ICD-10-CM

## 2019-08-28 DIAGNOSIS — T380X5A Adverse effect of glucocorticoids and synthetic analogues, initial encounter: Secondary | ICD-10-CM | POA: Diagnosis present

## 2019-08-28 DIAGNOSIS — J9601 Acute respiratory failure with hypoxia: Secondary | ICD-10-CM | POA: Diagnosis present

## 2019-08-28 DIAGNOSIS — E669 Obesity, unspecified: Secondary | ICD-10-CM | POA: Diagnosis present

## 2019-08-28 DIAGNOSIS — R609 Edema, unspecified: Secondary | ICD-10-CM | POA: Diagnosis not present

## 2019-08-28 LAB — COMPREHENSIVE METABOLIC PANEL
ALT: 42 U/L (ref 0–44)
AST: 51 U/L — ABNORMAL HIGH (ref 15–41)
Albumin: 3.2 g/dL — ABNORMAL LOW (ref 3.5–5.0)
Alkaline Phosphatase: 45 U/L (ref 38–126)
Anion gap: 13 (ref 5–15)
BUN: 18 mg/dL (ref 8–23)
CO2: 23 mmol/L (ref 22–32)
Calcium: 8.4 mg/dL — ABNORMAL LOW (ref 8.9–10.3)
Chloride: 105 mmol/L (ref 98–111)
Creatinine, Ser: 0.77 mg/dL (ref 0.61–1.24)
GFR calc Af Amer: >60 mL/min (ref >60)
GFR calc non Af Amer: >60 mL/min (ref >60)
Glucose, Bld: 118 mg/dL — ABNORMAL HIGH (ref 70–99)
Potassium: 3.3 mmol/L — ABNORMAL LOW (ref 3.5–5.1)
Sodium: 141 mmol/L (ref 135–145)
Total Bilirubin: 0.7 mg/dL (ref 0.3–1.2)
Total Protein: 6.6 g/dL (ref 6.5–8.1)

## 2019-08-28 LAB — URINALYSIS, ROUTINE W REFLEX MICROSCOPIC
Bilirubin Urine: NEGATIVE
Glucose, UA: NEGATIVE mg/dL
Hgb urine dipstick: NEGATIVE
Ketones, ur: NEGATIVE mg/dL
Leukocytes,Ua: NEGATIVE
Nitrite: NEGATIVE
Protein, ur: NEGATIVE mg/dL
Specific Gravity, Urine: 1.021 (ref 1.005–1.030)
pH: 7 (ref 5.0–8.0)

## 2019-08-28 LAB — FERRITIN: Ferritin: 572 ng/mL — ABNORMAL HIGH (ref 24–336)

## 2019-08-28 LAB — CBC WITH DIFFERENTIAL/PLATELET
Abs Immature Granulocytes: 0.07 10*3/uL (ref 0.00–0.07)
Basophils Absolute: 0 10*3/uL (ref 0.0–0.1)
Basophils Relative: 0 %
Eosinophils Absolute: 0 10*3/uL (ref 0.0–0.5)
Eosinophils Relative: 0 %
HCT: 44.1 % (ref 39.0–52.0)
Hemoglobin: 14.9 g/dL (ref 13.0–17.0)
Immature Granulocytes: 1 %
Lymphocytes Relative: 6 %
Lymphs Abs: 0.6 10*3/uL — ABNORMAL LOW (ref 0.7–4.0)
MCH: 28.5 pg (ref 26.0–34.0)
MCHC: 33.8 g/dL (ref 30.0–36.0)
MCV: 84.3 fL (ref 80.0–100.0)
Monocytes Absolute: 0.7 10*3/uL (ref 0.1–1.0)
Monocytes Relative: 7 %
Neutro Abs: 8.7 10*3/uL — ABNORMAL HIGH (ref 1.7–7.7)
Neutrophils Relative %: 86 %
Platelets: 238 10*3/uL (ref 150–400)
RBC: 5.23 MIL/uL (ref 4.22–5.81)
RDW: 13.3 % (ref 11.5–15.5)
WBC: 10.1 10*3/uL (ref 4.0–10.5)
nRBC: 0 % (ref 0.0–0.2)

## 2019-08-28 LAB — BRAIN NATRIURETIC PEPTIDE: B Natriuretic Peptide: 59.6 pg/mL (ref 0.0–100.0)

## 2019-08-28 LAB — SARS CORONAVIRUS 2 BY RT PCR (HOSPITAL ORDER, PERFORMED IN ~~LOC~~ HOSPITAL LAB): SARS Coronavirus 2: POSITIVE — AB

## 2019-08-28 LAB — PROTIME-INR
INR: 1 (ref 0.8–1.2)
Prothrombin Time: 12.5 seconds (ref 11.4–15.2)

## 2019-08-28 LAB — CREATININE, SERUM
Creatinine, Ser: 0.78 mg/dL (ref 0.61–1.24)
GFR calc Af Amer: >60 mL/min (ref >60)
GFR calc non Af Amer: >60 mL/min (ref >60)

## 2019-08-28 LAB — LACTIC ACID, PLASMA
Lactic Acid, Venous: 2.1 mmol/L (ref 0.5–1.9)
Lactic Acid, Venous: 1.8 mmol/L (ref 0.5–1.9)

## 2019-08-28 LAB — PHOSPHORUS: Phosphorus: 2.2 mg/dL — ABNORMAL LOW (ref 2.5–4.6)

## 2019-08-28 LAB — HIV ANTIBODY (ROUTINE TESTING W REFLEX): HIV Screen 4th Generation wRfx: NONREACTIVE

## 2019-08-28 LAB — MAGNESIUM: Magnesium: 1.8 mg/dL (ref 1.7–2.4)

## 2019-08-28 MED ORDER — ACETAMINOPHEN 500 MG PO TABS
1000.0000 mg | ORAL_TABLET | Freq: Once | ORAL | Status: AC
  Administered 2019-08-28: 1000 mg via ORAL
  Filled 2019-08-28: qty 2

## 2019-08-28 MED ORDER — LACTATED RINGERS IV BOLUS
1000.0000 mL | Freq: Once | INTRAVENOUS | Status: AC
  Administered 2019-08-28: 1000 mL via INTRAVENOUS

## 2019-08-28 MED ORDER — METHYLPREDNISOLONE SODIUM SUCC 125 MG IJ SOLR
80.0000 mg | Freq: Once | INTRAMUSCULAR | Status: AC
  Administered 2019-08-28: 80 mg via INTRAVENOUS
  Filled 2019-08-28: qty 2

## 2019-08-28 MED ORDER — SODIUM CHLORIDE 0.9 % IV SOLN: 100.0000 mg | Freq: Every day | INTRAVENOUS | Status: DC

## 2019-08-28 MED ORDER — METHYLPREDNISOLONE SODIUM SUCC 125 MG IJ SOLR
60.0000 mg | Freq: Two times a day (BID) | INTRAMUSCULAR | Status: DC
  Administered 2019-08-28 – 2019-08-30 (×4): 60 mg via INTRAVENOUS
  Filled 2019-08-28 (×4): qty 2

## 2019-08-28 MED ORDER — GUAIFENESIN 100 MG/5ML PO SOLN
5.0000 mL | Freq: Once | ORAL | Status: AC
  Administered 2019-08-28: 100 mg via ORAL
  Filled 2019-08-28: qty 5

## 2019-08-28 MED ORDER — ONDANSETRON HCL 4 MG PO TABS: 4.0000 mg | ORAL_TABLET | Freq: Four times a day (QID) | ORAL | Status: DC | PRN

## 2019-08-28 MED ORDER — ACETAMINOPHEN 325 MG PO TABS
650.0000 mg | ORAL_TABLET | Freq: Four times a day (QID) | ORAL | Status: DC | PRN
  Administered 2019-08-29 – 2019-08-31 (×5): 650 mg via ORAL
  Filled 2019-08-28 (×6): qty 2

## 2019-08-28 MED ORDER — SODIUM CHLORIDE 0.9 % IV SOLN: 200.0000 mg | Freq: Once | INTRAVENOUS | Status: DC

## 2019-08-28 MED ORDER — ONDANSETRON HCL 4 MG/2ML IJ SOLN: 4.0000 mg | Freq: Four times a day (QID) | INTRAMUSCULAR | Status: DC | PRN

## 2019-08-28 MED ORDER — SODIUM CHLORIDE 0.9 % IV SOLN
200.0000 mg | Freq: Once | INTRAVENOUS | Status: AC
  Administered 2019-08-28: 200 mg via INTRAVENOUS
  Filled 2019-08-28: qty 40

## 2019-08-28 MED ORDER — SODIUM CHLORIDE 0.9 % IV SOLN
100.0000 mg | Freq: Every day | INTRAVENOUS | Status: AC
  Administered 2019-08-29 – 2019-09-01 (×4): 100 mg via INTRAVENOUS
  Filled 2019-08-28 (×4): qty 20

## 2019-08-28 MED ORDER — ENOXAPARIN SODIUM 60 MG/0.6ML ~~LOC~~ SOLN
55.0000 mg | SUBCUTANEOUS | Status: DC
  Administered 2019-08-28 – 2019-09-07 (×11): 55 mg via SUBCUTANEOUS
  Filled 2019-08-28: qty 0.6
  Filled 2019-08-28: qty 0.55
  Filled 2019-08-28 (×2): qty 0.6
  Filled 2019-08-28: qty 0.55
  Filled 2019-08-28 (×6): qty 0.6
  Filled 2019-08-28: qty 0.55

## 2019-08-28 NOTE — ED Triage Notes (Signed)
Patient tested positive 8/5. Since then has been feeling weak/coughing up white foamy stuff/SOB/chest pain when breathing. Pt passed out today but it was unwitnessed. Pt does have a fever of 100.79f. pt 91% on 4L with EMS.

## 2019-08-28 NOTE — ED Notes (Signed)
Date and time results received: 08/28/19 4:37 PM  (use smartphrase ".now" to insert current time)  Test: lactic acid  Critical Value: 2.1  Name of Provider Notified: Gross MD   Orders Received? Or Actions Taken?:

## 2019-08-28 NOTE — H&P (Signed)
History and Physical    Jacob Page OZD:[id number] DOB: 1955/07/23 DOA: 08/28/2019  Referring MD/NP/PA: EDP PCP:  Patient coming from: Home  Chief Complaint: Cough, shortness of breath, fever chills and passed out today  HPI: Jacob Page is a 64 y.o. male with medical history significant of obesity, sleep apnea developed fever chills myalgia body aches and anorexia 9 days ago on 8/1.  Symptoms progressed, subsequently tested positive for COVID-19 at a local [facility] on 8/5.  Subsequently has had increased cough, shortness of breath with any activity, intermittent chest pain with cough.  Passed out today getting out of bed, reports poor p.o. intake all week, mostly sleeping or laying in bed this whole time. -Patient has not been vaccinated for COVID-19 -Arrived by EMS to [facility], ED, noted to have a temp of 100.8, arrived on 4 L nasal cannula however weaned off to 92-94% on room air which dropped down to high 80s with activity.  Work-up in the ED noted mild hypokalemia, blood glucose of 118, lactic acid was 2.1, white count was 10.1 SARS COVID-19 PCR was positive and chest x-ray showed multifocal pneumonia   Review of Systems: As per HPI otherwise 14 point review of systems negative.   Past Medical History:  Diagnosis Date  . History of kidney stones    "I had kidney stones around 202004-03-25, but they passed on their own"  . Pneumonia    "diagnosed maybe 10 years ago"  . Sleep apnea    "did a sleep study where you stay overnight about 35 years ago in [place], but I don't wear a CPAP because I feel fine"    Past Surgical History:  Procedure Laterality Date  . DENTAL SURGERY  2019   Per pt "I have implants on the upper front teeth, they don't come out. They gave me gas for the procedure"  . EXPLORATORY LAPAROTOMY  04/26/2019  . HEMORROIDECTOMY    . INCISIONAL HERNIA REPAIR  04/26/2019   WITH MESH  . INCISIONAL HERNIA REPAIR N/A 04/26/2019   Procedure: INCISIONAL HERNIA REPAIR WITH  MESH (TAR);  Surgeon: Axel Filler, MD;  Location: [facility] OR;  Service: General;  Laterality: N/A;  . INSERTION OF MESH N/A 04/26/2019   Procedure: Insertion Of Mesh;  Surgeon: Axel Filler, MD;  Location: [facility] OR;  Service: General;  Laterality: N/A;  . LAPAROTOMY N/A 04/26/2019   Procedure: EXPLORATORY LAPAROTOMY;  Surgeon: Axel Filler, MD;  Location: [facility] OR;  Service: General;  Laterality: N/A;  . LYSIS OF ADHESION N/A 04/26/2019   Procedure: LYSIS OF ADHESION;  Surgeon: Axel Filler, MD;  Location: [facility] OR;  Service: General;  Laterality: N/A;     reports that he has quit smoking. He has never used smokeless tobacco. He reports previous alcohol use. He reports previous drug use. Drug: Other-see comments.  No Known Allergies  History reviewed. No pertinent family history.   Prior to Admission medications   Medication Sig Start Date End Date Taking? Authorizing Provider  HYDROcodone-acetaminophen (NORCO/VICODIN) 5-325 MG tablet Take 1-2 tablets by mouth every 6 (six) hours as needed for moderate pain or severe pain. 04/29/19   Griselda Miner, MD  traMADol (ULTRAM) 50 MG tablet Take 1 tablet (50 mg total) by mouth every 6 (six) hours as needed (mild pain). 04/29/19   Griselda Miner, MD    Physical Exam: Vitals:   08/28/19 1518 08/28/19 1521 08/28/19 1530 08/28/19 1532  BP:  117/72 118/70   Pulse: 89  94 87  Resp: (!) 32  (!) 22 16  Temp: (!) 100.8 F (38.2 C)     TempSrc: Oral     SpO2: 93%  92% 93%  Weight:      Height:          Constitutional: Obese male laying in the stretcher, awake alert oriented x3, no distress, no oxygen on Vitals:   08/28/19 1518 08/28/19 1521 08/28/19 1530 08/28/19 1532  BP:  117/72 118/70   Pulse: 89  94 87  Resp: (!) 32  (!) 22 16  Temp: (!) 100.8 F (38.2 C)     TempSrc: Oral     SpO2: 93%  92% 93%  Weight:      Height:      HEENT: Neck obese, unable to assess JVD Respiratory: Distant breath sounds, otherwise clear Cardiovascular:  S1-S2, regular rate rhythm Abdomen: soft, non tender, Bowel sounds positive.  Musculoskeletal: No joint deformity upper and lower extremities. Ext: No edema Skin: no rashes on exposed skin Neuro: Moves all extremities, no localizing signs Psych, flat affect  Labs on Admission: I have personally reviewed following labs and imaging studies  CBC: Recent Labs  Lab 08/28/19 1524  WBC 10.1  NEUTROABS 8.7*  HGB 14.9  HCT 44.1  MCV 84.3  PLT 238   Basic Metabolic Panel: Recent Labs  Lab 08/28/19 1524  NA 141  K 3.3*  CL 105  CO2 23  GLUCOSE 118*  BUN 18  CREATININE 0.77  CALCIUM 8.4*  MG 1.8  PHOS 2.2*   GFR: Estimated Creatinine Clearance: 121.9 mL/min (by C-G formula based on SCr of 0.77 mg/dL). Liver Function Tests: Recent Labs  Lab 08/28/19 1524  AST 51*  ALT 42  ALKPHOS 45  BILITOT 0.7  PROT 6.6  ALBUMIN 3.2*   No results for input(s): LIPASE, AMYLASE in the last 168 hours. No results for input(s): AMMONIA in the last 168 hours. Coagulation Profile: Recent Labs  Lab 08/28/19 1524  INR 1.0   Cardiac Enzymes: No results for input(s): CKTOTAL, CKMB, CKMBINDEX, TROPONINI in the last 168 hours. BNP (last 3 results) No results for input(s): PROBNP in the last 8760 hours. HbA1C: No results for input(s): HGBA1C in the last 72 hours. CBG: No results for input(s): GLUCAP in the last 168 hours. Lipid Profile: No results for input(s): CHOL, HDL, LDLCALC, TRIG, CHOLHDL, LDLDIRECT in the last 72 hours. Thyroid Function Tests: No results for input(s): TSH, T4TOTAL, FREET4, T3FREE, THYROIDAB in the last 72 hours. Anemia Panel: No results for input(s): VITAMINB12, FOLATE, FERRITIN, TIBC, IRON, RETICCTPCT in the last 72 hours. Urine analysis: No results found for: COLORURINE, APPEARANCEUR, LABSPEC, PHURINE, GLUCOSEU, HGBUR, BILIRUBINUR, KETONESUR, PROTEINUR, UROBILINOGEN, NITRITE, LEUKOCYTESUR Sepsis Labs: @LABRCNTIP (procalcitonin:4,lacticidven:4) ) Recent  Results (from the past 240 hour(s))  SARS Coronavirus 2 by RT PCR (hospital order, performed in [facility] hospital lab) Nasopharyngeal Nasopharyngeal Swab     Status: Abnormal   Collection Time: 08/28/19  3:24 PM   Specimen: Nasopharyngeal Swab  Result Value Ref Range Status   SARS Coronavirus 2 POSITIVE (A) NEGATIVE Final    Comment: RESULT CALLED TO, READ BACK BY AND VERIFIED WITH: D,SIDBURY @1715  08/28/19 EB (NOTE) SARS-CoV-2 target nucleic acids are DETECTED  SARS-CoV-2 RNA is generally detectable in upper respiratory specimens  during the acute phase of infection.  Positive results are indicative  of the presence of the identified virus, but do not rule out bacterial infection or co-infection with other pathogens not detected by the test.  Clinical correlation  with patient history and  other diagnostic information is necessary to determine patient infection status.  The expected result is negative.  Fact Sheet for Patients:   BoilerBrush.com.cy   Fact Sheet for Healthcare Providers:   https://pope.com/    This test is not yet approved or cleared by the [place] FDA and  has been authorized for detection and/or diagnosis of SARS-CoV-2 by FDA under an Emergency Use Authorization (EUA).  This EUA will remain in effect (meaning this test can be  used) for the duration of  the COVID-19 declaration under Section 564(b)(1) of the Act, 21 U.S.C. section 360-bbb-3(b)(1), unless the authorization is terminated or revoked sooner.  Performed at [facility], [address], [place], [place] [address]      Radiological Exams on Admission: DG Chest 1 View  Result Date: 08/28/2019 CLINICAL DATA:  64 year old male with shortness of breath. EXAM: CHEST  1 VIEW COMPARISON:  Chest radiograph dated 05/19/2019 FINDINGS: Bilateral mid to lower lung field peripheral and subpleural densities most consistent with multifocal pneumonia,  likely viral or atypical in etiology including COVID-19. Clinical correlation is recommended. No lobar consolidation, pleural effusion, pneumothorax. Top-normal cardiac silhouette. No acute osseous pathology. IMPRESSION: Multifocal pneumonia. Clinical correlation is recommended. Electronically Signed   By: Elgie Collard M.D.   On: 08/28/2019 16:05    EKG: Independently reviewed.  Normal sinus rhythm, flattened T waves in inferior and lateral leads  Assessment/Plan Active Problems:   Pneumonia due to COVID-19 virus  -Admit to airborne isolation room -Start IV Solu-Medrol 60 mg every 12 and remdesivir -Monitor CRP/ferritin/D-dimer daily, add on labs today -Ambulate, increase activity as tolerated  Syncope -Suspect this is secondary to dehydration, ongoing hypoxia/Covid pneumonia -Hydrated with 2 L of LR in the ED -EKG with flat T waves in inferior and lateral leads -Monitor on telemetry,  -Will check 2D echocardiogram -Clinical suspicion of PE is low at this time but will also check Dopplers, follow-up right ventricle RV and PA pressures on echo  Hyperglycemia -Check hemoglobin A1c -Will likely need insulin with steroids  Obesity OSA -Diagnosed with sleep apnea 20 to 30 years ago does not use a CPAP at baseline -Needs weight loss/lifestyle modification  DVT prophylaxis: Lovenox Code Status: Full code Family Communication: No family at bedside, wife currently hospitalized with Covid pneumonia here at [facility] Disposition Plan: Home pending clinical improvement Consults called: None Admission status: Inpatient  Zannie Cove MD [facility]  08/28/2019, 5:19 PM

## 2019-08-28 NOTE — ED Notes (Addendum)
PT desat to 88, RR in mid 30's. Pt placed on 5L of 02. PT RR 30 and 02 sat 92. MD Chotnier aware. MD ordered ABG.

## 2019-08-28 NOTE — ED Notes (Signed)
Dinner ordered 

## 2019-08-28 NOTE — ED Provider Notes (Signed)
[facility] Provider Note   CSN: [id number] Arrival date & time: 08/28/19  1508     History Chief Complaint  Patient presents with  . Weakness  . Shortness of Breath  . Cough    Jacob Page is a 64 y.o. male.  HPI   Patient presents to the emergency department from home for shortness of breath and weakness.  The patient has been feeling ill since 8/1 and had subsequent positive COVID-19 test on 8/5.  The patient significant other is currently admitted in the hospital for COVID-19 illness.  The patient has had worsening weakness, coughing spells, shortness of breath and chest tightness with deep breathing.  Patient denies any previous history of lung disease.  Has not received his vaccinations.  Febrile today.  EMS placed the patient on 4 L nasal cannula for hypoxia at 88%.  The patient reports during coughing spells he has had several syncopal episodes and one today caused him to fall striking his right side.  He denies any nausea, vomiting or diarrhea.  Poor p.o. intake.  Intermittent abdominal discomfort secondary to hernia surgery recently during coughing spells.  No home O2 requirement at baseline.     Past Medical History:  Diagnosis Date  . History of kidney stones    "I had kidney stones around 2003/2004, but they passed on their own"  . Pneumonia    "diagnosed maybe 10 years ago"  . Sleep apnea    "did a sleep study where you stay overnight about 35 years ago in [place], but I don't wear a CPAP because I feel fine"    Patient Active Problem List   Diagnosis Date Noted  . Pneumonia due to COVID-19 virus 08/28/2019  . S/P hernia repair 04/26/2019    Past Surgical History:  Procedure Laterality Date  . DENTAL SURGERY  2019   Per pt "I have implants on the upper front teeth, they don't come out. They gave me gas for the procedure"  . EXPLORATORY LAPAROTOMY  04/26/2019  . HEMORROIDECTOMY    . INCISIONAL HERNIA REPAIR   04/26/2019   WITH MESH  . INCISIONAL HERNIA REPAIR N/A 04/26/2019   Procedure: INCISIONAL HERNIA REPAIR WITH MESH (TAR);  Surgeon: Axel Filleramirez, Armando, MD;  [facility];  Service: General;  Laterality: N/A;  . INSERTION OF MESH N/A 04/26/2019   Procedure: Insertion Of Mesh;  Surgeon: Axel Filleramirez, Armando, MD;  [facility];  Service: General;  Laterality: N/A;  . LAPAROTOMY N/A 04/26/2019   Procedure: EXPLORATORY LAPAROTOMY;  Surgeon: Axel Filleramirez, Armando, MD;  [facility];  Service: General;  Laterality: N/A;  . LYSIS OF ADHESION N/A 04/26/2019   Procedure: LYSIS OF ADHESION;  Surgeon: Axel Filleramirez, Armando, MD;  [facility];  Service: General;  Laterality: N/A;       History reviewed. No pertinent family history.  Social History   Tobacco Use  . Smoking status: Former Games developermoker  . Smokeless tobacco: Never Used  . Tobacco comment: pt quit smoking at age 64  Vaping Use  . Vaping Use: Never used  Substance Use Topics  . Alcohol use: Not Currently  . Drug use: Not Currently    Types: Other-see comments    Comment: CBD oil     Home Medications Prior to Admission medications   Medication Sig Start Date End Date Taking? Authorizing Provider  HYDROcodone-acetaminophen (NORCO/VICODIN) 5-325 MG tablet Take 1-2 tablets by mouth every 6 (six) hours as needed for moderate pain or severe  pain. 04/29/19   Griselda Miner, MD  traMADol (ULTRAM) 50 MG tablet Take 1 tablet (50 mg total) by mouth every 6 (six) hours as needed (mild pain). 04/29/19   Griselda Miner, MD    Allergies    Patient has no known allergies.  Review of Systems   Review of Systems  Constitutional: Positive for activity change, chills, fatigue and fever.  HENT: Negative for ear pain and sore throat.   Eyes: Negative for pain and visual disturbance.  Respiratory: Positive for chest tightness and shortness of breath. Negative for cough.   Cardiovascular: Negative for chest pain and palpitations.  Gastrointestinal: Negative for  abdominal pain and vomiting.  Genitourinary: Negative for dysuria and hematuria.  Musculoskeletal: Negative for arthralgias and back pain.  Skin: Negative for color change and rash.  Neurological: Negative for seizures and syncope.  All other systems reviewed and are negative.   Physical Exam Updated Vital Signs BP 118/70   Pulse 87   Temp (!) 100.8 F (38.2 C) (Oral)   Resp 16   Ht 6\' 1"  (1.854 m)   Wt 111.1 kg   SpO2 93%   BMI 32.32 kg/m   Physical Exam Vitals and nursing note reviewed.  Constitutional:      General: He is not in acute distress.    Appearance: He is well-developed and normal weight. He is ill-appearing. He is not toxic-appearing.  HENT:     Head: Normocephalic and atraumatic.  Eyes:     Extraocular Movements: Extraocular movements intact.     Conjunctiva/sclera: Conjunctivae normal.     Pupils: Pupils are equal, round, and reactive to light.  Cardiovascular:     Rate and Rhythm: Normal rate and regular rhythm.     Heart sounds: No murmur heard.   Pulmonary:     Effort: Pulmonary effort is normal. Tachypnea present. No respiratory distress.     Breath sounds: Rhonchi present.  Abdominal:     General: A surgical scar is present. There is no distension.     Palpations: Abdomen is soft.     Tenderness: There is no abdominal tenderness. There is no right CVA tenderness, left CVA tenderness, guarding or rebound.  Musculoskeletal:     Cervical back: Neck supple.     Right lower leg: No edema.     Left lower leg: No edema.  Skin:    General: Skin is warm and dry.     Capillary Refill: Capillary refill takes less than 2 seconds.  Neurological:     General: No focal deficit present.     Mental Status: He is alert and oriented to person, place, and time.  Psychiatric:        Mood and Affect: Mood normal.        Behavior: Behavior normal.     ED Results / Procedures / Treatments   Labs (all labs ordered are listed, but only abnormal results are  displayed) Labs Reviewed  COMPREHENSIVE METABOLIC PANEL - Abnormal; Notable for the following components:      Result Value   Potassium 3.3 (*)    Glucose, Bld 118 (*)    Calcium 8.4 (*)    Albumin 3.2 (*)    AST 51 (*)    All other components within normal limits  LACTIC ACID, PLASMA - Abnormal; Notable for the following components:   Lactic Acid, Venous 2.1 (*)    All other components within normal limits  CBC WITH DIFFERENTIAL/PLATELET - Abnormal; Notable for the  following components:   Neutro Abs 8.7 (*)    Lymphs Abs 0.6 (*)    All other components within normal limits  PHOSPHORUS - Abnormal; Notable for the following components:   Phosphorus 2.2 (*)    All other components within normal limits  SARS CORONAVIRUS 2 BY RT PCR (HOSPITAL ORDER, PERFORMED IN [facility])  CULTURE, BLOOD (ROUTINE X 2)  CULTURE, BLOOD (ROUTINE X 2)  URINE CULTURE  PROTIME-INR  BRAIN NATRIURETIC PEPTIDE  MAGNESIUM  LACTIC ACID, PLASMA  URINALYSIS, ROUTINE W REFLEX MICROSCOPIC    EKG EKG Interpretation  Date/Time:  Monday August 28 2019 15:22:53 EDT Ventricular Rate:  86 PR Interval:    QRS Duration: 94 QT Interval:  374 QTC Calculation: 448 R Axis:   -45 Text Interpretation: Sinus rhythm Left anterior fascicular block Consider anterolateral infarct Confirmed by Raeford Razor [phone]) on 08/28/2019 3:43:49 PM   Radiology DG Chest 1 View  Result Date: 08/28/2019 CLINICAL DATA:  64 year old male with shortness of breath. EXAM: CHEST  1 VIEW COMPARISON:  Chest radiograph dated 05/19/2019 FINDINGS: Bilateral mid to lower lung field peripheral and subpleural densities most consistent with multifocal pneumonia, likely viral or atypical in etiology including COVID-19. Clinical correlation is recommended. No lobar consolidation, pleural effusion, pneumothorax. Top-normal cardiac silhouette. No acute osseous pathology. IMPRESSION: Multifocal pneumonia. Clinical correlation is recommended.  Electronically Signed   By: Elgie Collard M.D.   On: 08/28/2019 16:05    Procedures Procedures (including critical care time)  Medications Ordered in ED Medications  guaiFENesin (ROBITUSSIN) 100 MG/5ML solution 100 mg (has no administration in time range)  acetaminophen (TYLENOL) tablet 1,000 mg (1,000 mg Oral Given 08/28/19 1606)  lactated ringers bolus 1,000 mL (1,000 mLs Intravenous New Bag/Given 08/28/19 1607)  lactated ringers bolus 1,000 mL (1,000 mLs Intravenous New Bag/Given 08/28/19 1704)    ED Course   BARACK NICODEMUS is a 65 y.o. male with PMHx listed that presents to the Emergency Department complaint of Weakness, Shortness of Breath, and Cough    ED Course: Initial exam completed.   Ill-appearing but hemodynamically stable.  Nontoxic and febrile.  Physical exam significant for obese 64 year old male with diminished breath sounds bilaterally with some mild rhonchi and tachypnea, extremities perfused, no lower extremity edema, and abdomen that is soft, nondistended, nontender with healed midline surgical scar.  Initial differential includes viral pneumonia, secondary bacterial pneumonia, multiple sources of sepsis including cystitis, pyelonephritis as well as electrolyte normalities, bacteremia.   Given these concerns, sepsis order set initiated.  Tylenol for fever.  1 L LR bolus for resuscitation efforts.  EKG sinus rhythm rate 86 with normal intervals and no acute ST changes or other signs of arrhythmia.  CBC without evidence of leukocytosis or leukopenia stable hemoglobin.  Platelet within normal limits.  CMP with hypokalemia 3.3 otherwise no acute electrolyte abnormalities requiring emergent intervention.  Magnesium 1.8.  Phosphorus 2.2.  BNP 59.  Blood cultures pending x2.  CXR multifocal PNA.  Initial lactic acid 2.1. COVID-19 positivity confirmed.   Apparent syncope related to coughing spells.  No evidence of arrhythmia or electrolyte abnormalities.  Doubt ACS.  Patient was  ambulated in the department by nursing staff and poorly tolerant, he was found to have hypoxia to 87% with significant work of breathing/fatigue.  Discussed with the hospitalist who agreed to meet the patient to their service for further management.  Day 9 of illness; patient may have worsening clinical decline over the next several days.  Diagnostics Vital  Signs: reviewed Labs: reviewed and significant findings discussed above Imaging: personally reviewed images interpreted by radiology EKG: reviewed Records: nursing notes along with previous records reviewed and pertinent data discussed   Consults:  Hospitalist   Reevaluation/Disposition:  Upon reevaluation, patients symptoms stable.   All questions answered.  Patient and/or family was understanding and in agreement with today's assessment and plan.   Campbell Riches, MD Emergency Medicine, PGY-3   Note: Dragon medical dictation software was used in the creation of this note.   Final Clinical Impression(s) / ED Diagnoses Final diagnoses:  COVID-19  Multifocal pneumonia    Rx / DC Orders ED Discharge Orders    None       Nino Parsley, MD 08/28/19 Barnie Mort    Raeford Razor, MD 08/28/19 2314

## 2019-08-29 ENCOUNTER — Inpatient Hospital Stay (HOSPITAL_COMMUNITY): Payer: 59

## 2019-08-29 DIAGNOSIS — R55 Syncope and collapse: Secondary | ICD-10-CM | POA: Diagnosis not present

## 2019-08-29 DIAGNOSIS — R609 Edema, unspecified: Secondary | ICD-10-CM | POA: Diagnosis not present

## 2019-08-29 DIAGNOSIS — U071 COVID-19: Secondary | ICD-10-CM | POA: Diagnosis not present

## 2019-08-29 DIAGNOSIS — J1282 Pneumonia due to Coronavirus disease 2019: Secondary | ICD-10-CM | POA: Diagnosis not present

## 2019-08-29 DIAGNOSIS — J9621 Acute and chronic respiratory failure with hypoxia: Secondary | ICD-10-CM

## 2019-08-29 LAB — I-STAT ARTERIAL BLOOD GAS, ED
Acid-Base Excess: 2 mmol/L (ref 0.0–2.0)
Bicarbonate: 25.3 mmol/L (ref 20.0–28.0)
Calcium, Ion: 1.14 mmol/L — ABNORMAL LOW (ref 1.15–1.40)
HCT: 39 % (ref 39.0–52.0)
Hemoglobin: 13.3 g/dL (ref 13.0–17.0)
O2 Saturation: 95 %
Patient temperature: 98.8
Potassium: 3.7 mmol/L (ref 3.5–5.1)
Sodium: 141 mmol/L (ref 135–145)
TCO2: 26 mmol/L (ref 22–32)
pCO2 arterial: 34.5 mmHg (ref 32.0–48.0)
pH, Arterial: 7.473 — ABNORMAL HIGH (ref 7.350–7.450)
pO2, Arterial: 69 mmHg — ABNORMAL LOW (ref 83.0–108.0)

## 2019-08-29 LAB — HIGH SENSITIVITY CRP: CRP, High Sensitivity: 43.81 mg/L — ABNORMAL HIGH (ref 0.00–3.00)

## 2019-08-29 LAB — COMPREHENSIVE METABOLIC PANEL
ALT: 39 U/L (ref 0–44)
AST: 41 U/L (ref 15–41)
Albumin: 2.8 g/dL — ABNORMAL LOW (ref 3.5–5.0)
Alkaline Phosphatase: 40 U/L (ref 38–126)
Anion gap: 11 (ref 5–15)
BUN: 16 mg/dL (ref 8–23)
CO2: 26 mmol/L (ref 22–32)
Calcium: 8.3 mg/dL — ABNORMAL LOW (ref 8.9–10.3)
Chloride: 104 mmol/L (ref 98–111)
Creatinine, Ser: 0.65 mg/dL (ref 0.61–1.24)
GFR calc Af Amer: >60 mL/min (ref >60)
GFR calc non Af Amer: >60 mL/min (ref >60)
Glucose, Bld: 150 mg/dL — ABNORMAL HIGH (ref 70–99)
Potassium: 3.9 mmol/L (ref 3.5–5.1)
Sodium: 141 mmol/L (ref 135–145)
Total Bilirubin: 1 mg/dL (ref 0.3–1.2)
Total Protein: 6.2 g/dL — ABNORMAL LOW (ref 6.5–8.1)

## 2019-08-29 LAB — HEMOGLOBIN A1C
Hgb A1c MFr Bld: 5.8 % — ABNORMAL HIGH (ref 4.8–5.6)
Mean Plasma Glucose: 119.76 mg/dL

## 2019-08-29 LAB — URINE CULTURE: Culture: NO GROWTH

## 2019-08-29 LAB — CBC WITH DIFFERENTIAL/PLATELET
Abs Immature Granulocytes: 0.04 10*3/uL (ref 0.00–0.07)
Basophils Absolute: 0 10*3/uL (ref 0.0–0.1)
Basophils Relative: 0 %
Eosinophils Absolute: 0 10*3/uL (ref 0.0–0.5)
Eosinophils Relative: 0 %
HCT: 43.1 % (ref 39.0–52.0)
Hemoglobin: 14.1 g/dL (ref 13.0–17.0)
Immature Granulocytes: 1 %
Lymphocytes Relative: 6 %
Lymphs Abs: 0.5 10*3/uL — ABNORMAL LOW (ref 0.7–4.0)
MCH: 28.2 pg (ref 26.0–34.0)
MCHC: 32.7 g/dL (ref 30.0–36.0)
MCV: 86.2 fL (ref 80.0–100.0)
Monocytes Absolute: 0.4 10*3/uL (ref 0.1–1.0)
Monocytes Relative: 4 %
Neutro Abs: 7.8 10*3/uL — ABNORMAL HIGH (ref 1.7–7.7)
Neutrophils Relative %: 89 %
Platelets: 230 10*3/uL (ref 150–400)
RBC: 5 MIL/uL (ref 4.22–5.81)
RDW: 13.3 % (ref 11.5–15.5)
WBC: 8.7 10*3/uL (ref 4.0–10.5)
nRBC: 0 % (ref 0.0–0.2)

## 2019-08-29 LAB — ECHOCARDIOGRAM LIMITED
Area-P 1/2: 3.08 cm2
Height: 73 in
S' Lateral: 3.1 cm
Weight: 3920 oz

## 2019-08-29 LAB — PROCALCITONIN: Procalcitonin: <0.1 ng/mL

## 2019-08-29 LAB — CBG MONITORING, ED
Glucose-Capillary: 140 mg/dL — ABNORMAL HIGH (ref 70–99)
Glucose-Capillary: 129 mg/dL — ABNORMAL HIGH (ref 70–99)
Glucose-Capillary: 162 mg/dL — ABNORMAL HIGH (ref 70–99)

## 2019-08-29 LAB — MAGNESIUM: Magnesium: 1.6 mg/dL — ABNORMAL LOW (ref 1.7–2.4)

## 2019-08-29 LAB — FERRITIN: Ferritin: 589 ng/mL — ABNORMAL HIGH (ref 24–336)

## 2019-08-29 LAB — C-REACTIVE PROTEIN: CRP: 8.4 mg/dL — ABNORMAL HIGH (ref <1.0)

## 2019-08-29 LAB — D-DIMER, QUANTITATIVE: D-Dimer, Quant: 0.48 ug/mL-FEU (ref 0.00–0.50)

## 2019-08-29 MED ORDER — HYDROCOD POLST-CPM POLST ER 10-8 MG/5ML PO SUER
5.0000 mL | Freq: Two times a day (BID) | ORAL | Status: DC | PRN
  Administered 2019-08-31 – 2019-09-07 (×6): 5 mL via ORAL
  Filled 2019-08-29 (×6): qty 5

## 2019-08-29 MED ORDER — BENZONATATE 100 MG PO CAPS
200.0000 mg | ORAL_CAPSULE | Freq: Three times a day (TID) | ORAL | Status: DC
  Administered 2019-08-29 – 2019-09-08 (×30): 200 mg via ORAL
  Filled 2019-08-29 (×32): qty 2

## 2019-08-29 MED ORDER — TOCILIZUMAB 400 MG/20ML IV SOLN
800.0000 mg | Freq: Once | INTRAVENOUS | Status: AC
  Administered 2019-08-29: 800 mg via INTRAVENOUS
  Filled 2019-08-29: qty 40

## 2019-08-29 MED ORDER — MAGNESIUM SULFATE 4 GM/100ML IV SOLN
4.0000 g | Freq: Once | INTRAVENOUS | Status: AC
  Administered 2019-08-29: 4 g via INTRAVENOUS
  Filled 2019-08-29: qty 100

## 2019-08-29 MED ORDER — ALBUTEROL SULFATE HFA 108 (90 BASE) MCG/ACT IN AERS
2.0000 | INHALATION_SPRAY | RESPIRATORY_TRACT | Status: DC | PRN
  Filled 2019-08-29: qty 6.7

## 2019-08-29 NOTE — ED Notes (Signed)
Admitting paged to RN per her request 

## 2019-08-29 NOTE — ED Notes (Signed)
Spoke with MD Loney Loh. MD requests holding additional solumedrol dose and would like to consult with RT to start high flow.

## 2019-08-29 NOTE — ED Notes (Addendum)
MD Chotnier aware of ABG results. Verbal order given to nurse to give additional dose of 60 mg solumedrol.

## 2019-08-29 NOTE — ED Notes (Signed)
Pt placed on 6 lpm by RN due to low spo2

## 2019-08-29 NOTE — ED Notes (Signed)
Lunch Tray Ordered @ 1048. °

## 2019-08-29 NOTE — ED Notes (Signed)
Linens changed on hospital bed. Am products given with fresh pitcher of water. Patient stood to urinate and his o2 sats dropped to 88% after getting back in bed did slowly increase to 91% on 6 liters.

## 2019-08-29 NOTE — Progress Notes (Signed)
Lower extremity venous has been completed.   Preliminary results in CV Proc.   Blanch Media 08/29/2019 10:36 AM

## 2019-08-29 NOTE — Progress Notes (Signed)
PROGRESS NOTE                                                                                                                                                                                                             Patient Demographics:    Jacob Page, is a 64 y.o. male, DOB - 1955/08/14, XLK:[id number]  Outpatient Primary MD for the patient is Paulina FusiSchultz, Douglas E, MD   Admit date - 08/28/2019   LOS - 1  Chief Complaint  Patient presents with  . Weakness  . Shortness of Breath  . Cough       Brief Narrative: Patient is a 64 y.o. male with no significant past medical history-who was diagnosed with COVID-19 on 8/5 (unvaccinated-spouse is currently hospitalized in 5 W.)-presented with a 4-5-day history of worsening shortness of breath and an episode of syncope.  He was found to have acute hypoxic respiratory failure secondary to COVID-19 pneumonia.  See below for further details.  Significant Events: 8/5>> COVID-19 positive  8/9>> [facility] syncope/hypoxia (3-4 L of oxygen) due to COVID-19 pneumonia 8/10>> some worsening hypoxia-up to 6 L of oxygen.  Desaturates more with minimal activity.  Actemra ordered.  Significant studies: 8/9>>Chest x-ray: Multifocal pneumonia 8/10>> lower extremity Doppler: Negative for DVT (preliminary)  COVID-19 medications: Steroids: 8/9>> Remdesivir: 8/9>> Actemra: 8/9 x 1  Antibiotics: None  Microbiology data: 8/9 >>blood culture: Spoke to micro lab-no growth 8/9>>urine culture:pending  Procedures: None  Consults: None  DVT prophylaxis: Prophylactic Lovenox at daily dosing    Subjective:    Jacob LopeAlton Petrakis today appears comfortable-he does not feel any better-acknowledges shortness of breath with minimal activity.  On 6 L of oxygen this morning.   Assessment  & Plan :   Acute Hypoxic Resp Failure due to Covid 19 Viral pneumonia: Worsening hypoxemia-up to 6 L of  oxygen this morning-continue steroids/remdesivir.  Use of Actemra discussed with patient-rationale/risk/benefits discussed-he does not have a history of TB, hepatitis B, chronic diverticulitis-he understands risks-and consents to the use of Actemra.  Continue to monitor closely-awaiting bed in 5 W.  Fever: afebrile O2 requirements:  SpO2: (!) 88 % O2 Flow Rate (L/min): 6 L/min  Prone/Incentive Spirometry: encouraged patient to lie prone for 3-4 hours at a time for a total of 16 hours a day, and to encourage incentive spirometry use  3-4/hour.  COVID-19 Labs: Recent Labs    08/28/19 1830 08/29/19 0757  DDIMER  --  0.48  FERRITIN 572* 589*  CRP  --  8.4*       Component Value Date/Time   BNP 59.6 08/28/2019 1524    Recent Labs  Lab 08/29/19 0757  PROCALCITON <0.10    Lab Results  Component Value Date   SARSCOV2NAA POSITIVE (A) 08/28/2019   SARSCOV2NAA NEGATIVE 04/22/2019    Prone/Incentive Spirometry: encouraged patient to lie prone for 3-4 hours at a time for a total of 16 hours a day, and to encourage incentive spirometry use 3-4/hour.  Syncope: Suspect related to either vasovagal mechanism or orthostatic mechanism in the setting of COVID-19.  Telemetry unremarkable overnight-awaiting echo  Hypomagnesemia: Replete and recheck.  Obesity: Estimated body mass index is 32.32 kg/m as calculated from the following:   Height as of this encounter: 6\' 1"  (1.854 m).   Weight as of this encounter: 111.1 kg.    ABG:    Component Value Date/Time   PHART 7.473 (H) 08/29/2019 0019   PCO2ART 34.5 08/29/2019 0019   PO2ART 69 (L) 08/29/2019 0019   HCO3 25.3 08/29/2019 0019   TCO2 26 08/29/2019 0019   O2SAT 95.0 08/29/2019 0019    Condition - Extremely Guarded  Family Communication  :  Spouse over the phone (she is a patient in 19 [facility].)  Code Status :  Full Code  Diet :  Diet Order            Diet 2 gram sodium Room service appropriate? Yes; Fluid consistency: Thin  Diet  effective now                  Disposition Plan  :   Status is: Inpatient  Remains inpatient appropriate because:Inpatient level of care appropriate due to severity of illness  Dispo: The patient is from: Home              Anticipated d/c is to: Home              Anticipated d/c date is: > 3 days              Patient currently is not medically stable to d/c.   Barriers to discharge: Hypoxia requiring O2 supplementation/complete 5 days of IV Remdesivir  Antimicorbials  :    Anti-infectives (From admission, onward)   Start     Dose/Rate Route Frequency Ordered Stop   08/29/19 1000  remdesivir 100 mg in sodium chloride 0.9 % 100 mL IVPB  Status:  Discontinued       "Followed by" Linked Group Details   100 mg 200 mL/hr over 30 Minutes Intravenous Daily 08/28/19 1846 08/28/19 1852   08/29/19 1000  remdesivir 100 mg in sodium chloride 0.9 % 100 mL IVPB     Discontinue    "Followed by" Linked Group Details   100 mg 200 mL/hr over 30 Minutes Intravenous Daily 08/28/19 1735 09/02/19 0959   08/28/19 1846  remdesivir 200 mg in sodium chloride 0.9% 250 mL IVPB  Status:  Discontinued       "Followed by" Linked Group Details   200 mg 580 mL/hr over 30 Minutes Intravenous Once 08/28/19 1846 08/28/19 1852   08/28/19 1745  remdesivir 200 mg in sodium chloride 0.9% 250 mL IVPB       "Followed by" Linked Group Details   200 mg 580 mL/hr over 30 Minutes Intravenous Once 08/28/19 1735 08/28/19 1939  Inpatient Medications  Scheduled Meds: . benzonatate  200 mg Oral TID  . enoxaparin (LOVENOX) injection  55 mg Subcutaneous Q24H  . methylPREDNISolone (SOLU-MEDROL) injection  60 mg Intravenous Q12H   Continuous Infusions: . magnesium sulfate bolus IVPB    . remdesivir 100 mg in NS 100 mL    . tocilizumab (ACTEMRA) - non-COVID treatment     PRN Meds:.acetaminophen, albuterol, chlorpheniramine-HYDROcodone, ondansetron **OR** ondansetron (ZOFRAN) IV   Time Spent in minutes  35    See all Orders from today for further details   Jeoffrey Massed M.D on 08/29/2019 at 11:16 AM  To page go to [email or web address] - use universal password  [facility] -  Office  [phone]    Objective:   Vitals:   08/29/19 0200 08/29/19 0229 08/29/19 0334 08/29/19 0641  BP: 115/78 115/78 123/82 115/79  Pulse: 71 71 71 76  Resp: (!) 27 (!) 25 14 20   Temp:   98.5 F (36.9 C) 98.7 F (37.1 C)  TempSrc:   Oral Oral  SpO2: (!) 89% 91% 93% (!) 88%  Weight:      Height:        Wt Readings from Last 3 Encounters:  08/28/19 111.1 kg  04/26/19 117.9 kg  04/19/19 121.9 kg     Intake/Output Summary (Last 24 hours) at 08/29/2019 1116 Last data filed at 08/29/2019 0648 Gross per 24 hour  Intake 2291.73 ml  Output 650 ml  Net 1641.73 ml     Physical Exam Gen Exam:Alert awake-not in any distress HEENT:atraumatic, normocephalic Chest: B/L clear to auscultation anteriorly CVS:S1S2 regular Abdomen:soft non tender, non distended Extremities:no edema Neurology: Non focal Skin: no rash   Data Review:    CBC Recent Labs  Lab 08/28/19 1524 08/29/19 0019 08/29/19 0757  WBC 10.1  --  8.7  HGB 14.9 13.3 14.1  HCT 44.1 39.0 43.1  PLT 238  --  230  MCV 84.3  --  86.2  MCH 28.5  --  28.2  MCHC 33.8  --  32.7  RDW 13.3  --  13.3  LYMPHSABS 0.6*  --  0.5*  MONOABS 0.7  --  0.4  EOSABS 0.0  --  0.0  BASOSABS 0.0  --  0.0    Chemistries  Recent Labs  Lab 08/28/19 1524 08/28/19 2037 08/29/19 0019 08/29/19 0757  NA 141  --  141 141  K 3.3*  --  3.7 3.9  CL 105  --   --  104  CO2 23  --   --  26  GLUCOSE 118*  --   --  150*  BUN 18  --   --  16  CREATININE 0.77 0.78  --  0.65  CALCIUM 8.4*  --   --  8.3*  MG 1.8  --   --  1.6*  AST 51*  --   --  41  ALT 42  --   --  39  ALKPHOS 45  --   --  40  BILITOT 0.7  --   --  1.0   ------------------------------------------------------------------------------------------------------------------ No results for  input(s): CHOL, HDL, LDLCALC, TRIG, CHOLHDL, LDLDIRECT in the last 72 hours.  Lab Results  Component Value Date   HGBA1C 5.8 (H) 08/29/2019   ------------------------------------------------------------------------------------------------------------------ No results for input(s): TSH, T4TOTAL, T3FREE, THYROIDAB in the last 72 hours.  Invalid input(s): FREET3 ------------------------------------------------------------------------------------------------------------------ Recent Labs    08/28/19 1830 08/29/19 0757  FERRITIN 572* 589*    Coagulation profile Recent Labs  Lab  08/28/19 1524  INR 1.0    Recent Labs    08/29/19 0757  DDIMER 0.48    Cardiac Enzymes No results for input(s): CKMB, TROPONINI, MYOGLOBIN in the last 168 hours.  Invalid input(s): CK ------------------------------------------------------------------------------------------------------------------    Component Value Date/Time   BNP 59.6 08/28/2019 1524    Micro Results Recent Results (from the past 240 hour(s))  SARS Coronavirus 2 by RT PCR (hospital order, performed in [facility] hospital lab) Nasopharyngeal Nasopharyngeal Swab     Status: Abnormal   Collection Time: 08/28/19  3:24 PM   Specimen: Nasopharyngeal Swab  Result Value Ref Range Status   SARS Coronavirus 2 POSITIVE (A) NEGATIVE Final    Comment: RESULT CALLED TO, READ BACK BY AND VERIFIED WITH: D,SIDBURY @1715  08/28/19 EB (NOTE) SARS-CoV-2 target nucleic acids are DETECTED  SARS-CoV-2 RNA is generally detectable in upper respiratory specimens  during the acute phase of infection.  Positive results are indicative  of the presence of the identified virus, but do not rule out bacterial infection or co-infection with other pathogens not detected by the test.  Clinical correlation with patient history and  other diagnostic information is necessary to determine patient infection status.  The expected result is negative.  Fact Sheet  for Patients:   10/28/19   Fact Sheet for Healthcare Providers:   BoilerBrush.com.cy    This test is not yet approved or cleared by the https://pope.com/ FDA and  has been authorized for detection and/or diagnosis of SARS-CoV-2 by FDA under an Emergency Use Authorization (EUA).  This EUA will remain in effect (meaning this test can be  used) for the duration of  the COVID-19 declaration under Section 564(b)(1) of the Act, 21 U.S.C. section 360-bbb-3(b)(1), unless the authorization is terminated or revoked sooner.  Performed at [facility], [address], [place], [place]   Culture, blood (Routine x 2)     Status: None (Preliminary result)   Collection Time: 08/28/19  5:34 PM   Specimen: BLOOD  Result Value Ref Range Status   Specimen Description BLOOD SITE NOT SPECIFIED  Final   Special Requests   Final    BOTTLES DRAWN AEROBIC ONLY Blood Culture results may not be optimal due to an excessive volume of blood received in culture bottles Performed at [facility], [address], [place], [place]    Culture PENDING  Incomplete   Report Status PENDING  Incomplete    Radiology Reports DG Chest 1 View  Result Date: 08/28/2019 CLINICAL DATA:  64 year old male with shortness of breath. EXAM: CHEST  1 VIEW COMPARISON:  Chest radiograph dated 05/19/2019 FINDINGS: Bilateral mid to lower lung field peripheral and subpleural densities most consistent with multifocal pneumonia, likely viral or atypical in etiology including COVID-19. Clinical correlation is recommended. No lobar consolidation, pleural effusion, pneumothorax. Top-normal cardiac silhouette. No acute osseous pathology. IMPRESSION: Multifocal pneumonia. Clinical correlation is recommended. Electronically Signed   By: 05/21/2019 M.D.   On: 08/28/2019 16:05   VAS 10/28/2019 LOWER EXTREMITY VENOUS (DVT)  Result Date: 08/29/2019  Lower Venous DVTStudy  Indications: Edema.  Comparison Study: no prior Performing Technologist: 10/29/2019 RVS  Examination Guidelines: A complete evaluation includes B-mode imaging, spectral Doppler, color Doppler, and power Doppler as needed of all accessible portions of each vessel. Bilateral testing is considered an integral part of a complete examination. Limited examinations for reoccurring indications may be performed as noted. The reflux portion of the exam is performed with  the patient in reverse Trendelenburg.  +---------+---------------+---------+-----------+----------+--------------+ RIGHT    CompressibilityPhasicitySpontaneityPropertiesThrombus Aging +---------+---------------+---------+-----------+----------+--------------+ CFV      Full           Yes      Yes                                 +---------+---------------+---------+-----------+----------+--------------+ SFJ      Full                                                        +---------+---------------+---------+-----------+----------+--------------+ FV Prox  Full                                                        +---------+---------------+---------+-----------+----------+--------------+ FV Mid   Full                                                        +---------+---------------+---------+-----------+----------+--------------+ FV DistalFull                                                        +---------+---------------+---------+-----------+----------+--------------+ PFV      Full                                                        +---------+---------------+---------+-----------+----------+--------------+ POP      Full           Yes      Yes                                 +---------+---------------+---------+-----------+----------+--------------+ PTV      Full                                                        +---------+---------------+---------+-----------+----------+--------------+  PERO     Full                                                        +---------+---------------+---------+-----------+----------+--------------+   +---------+---------------+---------+-----------+----------+--------------+ LEFT     CompressibilityPhasicitySpontaneityPropertiesThrombus Aging +---------+---------------+---------+-----------+----------+--------------+ CFV      Full           Yes      Yes                                 +---------+---------------+---------+-----------+----------+--------------+  SFJ      Full                                                        +---------+---------------+---------+-----------+----------+--------------+ FV Prox  Full                                                        +---------+---------------+---------+-----------+----------+--------------+ FV Mid   Full                                                        +---------+---------------+---------+-----------+----------+--------------+ FV DistalFull                                                        +---------+---------------+---------+-----------+----------+--------------+ PFV      Full                                                        +---------+---------------+---------+-----------+----------+--------------+ POP      Full           Yes      Yes                                 +---------+---------------+---------+-----------+----------+--------------+ PTV      Full                                                        +---------+---------------+---------+-----------+----------+--------------+ PERO     Full                                                        +---------+---------------+---------+-----------+----------+--------------+     Summary: BILATERAL: - No evidence of deep vein thrombosis seen in the lower extremities, bilaterally. - No evidence of superficial venous thrombosis in the lower extremities, bilaterally. -    *See table(s) above for measurements and observations.    Preliminary

## 2019-08-29 NOTE — Progress Notes (Addendum)
Floor coverage  Patient admitted 8/9 for COVID-19 viral pneumonia. Paged by nursing staff due to concern for increased respiratory rate. Reported that patient was initially requiring 4 L supplemental oxygen and was weaned down to 2 L. However, became tachypneic with respiratory rate in the 30s. ABG with pH 7.47, PCO2 34, and PO2 69.  Patient was seen and examined at bedside. Resting comfortably and has no complaints other than a headache for which he received Tylenol. Currently satting 94-95% on 6 L supplemental oxygen via high flow nasal cannula. Respiratory rate 18-22. No increased work of breathing.  Heart: RRR Lungs: Equal air entry bilaterally. No wheezes or rales appreciated. Extremities: No edema  Plan -Continue supplemental oxygen via high flow nasal cannula with goal to keep oxygen saturation above 90% -Continue Solu-Medrol 60 mg every 12 hours -Continue remdesivir -Inflammatory markers including CRP, D-dimer pending -Change bed request from telemetry to progressive care unit -Continue to monitor very closely

## 2019-08-29 NOTE — ED Notes (Addendum)
MD Rathore paged about ABG results. Notified about tachypnea and 02 sat of 90 on 5 L. Nurse is waiting for response.

## 2019-08-29 NOTE — Progress Notes (Signed)
  Echocardiogram 2D Echocardiogram has been performed.  Jacob Page 08/29/2019, 3:01 PM

## 2019-08-30 DIAGNOSIS — U071 COVID-19: Secondary | ICD-10-CM | POA: Diagnosis not present

## 2019-08-30 DIAGNOSIS — J1282 Pneumonia due to Coronavirus disease 2019: Secondary | ICD-10-CM | POA: Diagnosis not present

## 2019-08-30 DIAGNOSIS — J189 Pneumonia, unspecified organism: Secondary | ICD-10-CM | POA: Diagnosis not present

## 2019-08-30 LAB — CBC
HCT: 46 % (ref 39.0–52.0)
Hemoglobin: 15.2 g/dL (ref 13.0–17.0)
MCH: 28.7 pg (ref 26.0–34.0)
MCHC: 33 g/dL (ref 30.0–36.0)
MCV: 87 fL (ref 80.0–100.0)
Platelets: 290 10*3/uL (ref 150–400)
RBC: 5.29 MIL/uL (ref 4.22–5.81)
RDW: 13.3 % (ref 11.5–15.5)
WBC: 11.9 10*3/uL — ABNORMAL HIGH (ref 4.0–10.5)
nRBC: 0 % (ref 0.0–0.2)

## 2019-08-30 LAB — MAGNESIUM: Magnesium: 2.3 mg/dL (ref 1.7–2.4)

## 2019-08-30 LAB — COMPREHENSIVE METABOLIC PANEL
ALT: 33 U/L (ref 0–44)
AST: 33 U/L (ref 15–41)
Albumin: 2.8 g/dL — ABNORMAL LOW (ref 3.5–5.0)
Alkaline Phosphatase: 48 U/L (ref 38–126)
Anion gap: 12 (ref 5–15)
BUN: 19 mg/dL (ref 8–23)
CO2: 26 mmol/L (ref 22–32)
Calcium: 8.3 mg/dL — ABNORMAL LOW (ref 8.9–10.3)
Chloride: 101 mmol/L (ref 98–111)
Creatinine, Ser: 0.65 mg/dL (ref 0.61–1.24)
GFR calc Af Amer: >60 mL/min (ref >60)
GFR calc non Af Amer: >60 mL/min (ref >60)
Glucose, Bld: 157 mg/dL — ABNORMAL HIGH (ref 70–99)
Potassium: 3.8 mmol/L (ref 3.5–5.1)
Sodium: 139 mmol/L (ref 135–145)
Total Bilirubin: 0.8 mg/dL (ref 0.3–1.2)
Total Protein: 6.2 g/dL — ABNORMAL LOW (ref 6.5–8.1)

## 2019-08-30 LAB — C-REACTIVE PROTEIN: CRP: 3.9 mg/dL — ABNORMAL HIGH (ref <1.0)

## 2019-08-30 LAB — D-DIMER, QUANTITATIVE: D-Dimer, Quant: 0.69 ug/mL-FEU — ABNORMAL HIGH (ref 0.00–0.50)

## 2019-08-30 LAB — CBG MONITORING, ED: Glucose-Capillary: 165 mg/dL — ABNORMAL HIGH (ref 70–99)

## 2019-08-30 LAB — GLUCOSE, CAPILLARY: Glucose-Capillary: 198 mg/dL — ABNORMAL HIGH (ref 70–99)

## 2019-08-30 LAB — FERRITIN: Ferritin: 744 ng/mL — ABNORMAL HIGH (ref 24–336)

## 2019-08-30 MED ORDER — METHYLPREDNISOLONE SODIUM SUCC 125 MG IJ SOLR
125.0000 mg | Freq: Two times a day (BID) | INTRAMUSCULAR | Status: DC
  Administered 2019-08-30 – 2019-09-01 (×5): 125 mg via INTRAVENOUS
  Filled 2019-08-30 (×5): qty 2

## 2019-08-30 MED ORDER — FUROSEMIDE 10 MG/ML IJ SOLN
40.0000 mg | Freq: Two times a day (BID) | INTRAMUSCULAR | Status: AC
  Administered 2019-08-30 – 2019-09-01 (×4): 40 mg via INTRAVENOUS
  Filled 2019-08-30 (×4): qty 4

## 2019-08-30 MED ORDER — METHYLPREDNISOLONE SODIUM SUCC 125 MG IJ SOLR: 125.0000 mg | Freq: Two times a day (BID) | INTRAMUSCULAR | Status: DC

## 2019-08-30 MED ORDER — FUROSEMIDE 10 MG/ML IJ SOLN
40.0000 mg | Freq: Once | INTRAMUSCULAR | Status: AC
  Administered 2019-08-30: 40 mg via INTRAVENOUS
  Filled 2019-08-30: qty 4

## 2019-08-30 MED ORDER — METHYLPREDNISOLONE SODIUM SUCC 125 MG IJ SOLR: 60.0000 mg | Freq: Two times a day (BID) | INTRAMUSCULAR | Status: DC

## 2019-08-30 MED ORDER — METHYLPREDNISOLONE SODIUM SUCC 125 MG IJ SOLR
60.0000 mg | Freq: Once | INTRAMUSCULAR | Status: AC
  Administered 2019-08-30: 60 mg via INTRAVENOUS
  Filled 2019-08-30: qty 2

## 2019-08-30 NOTE — ED Notes (Signed)
RT at bedside to place patient on NRB

## 2019-08-30 NOTE — ED Notes (Signed)
Breakfast tray at bedside 

## 2019-08-30 NOTE — ED Notes (Signed)
O2 sats improved to 87% on heated high flow O2, wob decreased, pt reports decrease in sob at this time.

## 2019-08-30 NOTE — ED Notes (Signed)
pts sats improved to 93% on NRB

## 2019-08-30 NOTE — Progress Notes (Signed)
Jacob Page  ZOX:[id number] DOB: 23-Sep-1955 DOA: 08/28/2019 PCP: Paulina Fusi, MD    Brief Narrative:  64 year old with no significant medical history who was diagnosed with Covid 8/5 who presented with a 4 to 5-day history of progressively worsening shortness of breath and an episode of syncope.  His wife had previously been hospitalized with Covid.  In the ED he was found to be suffering with acute hypoxic respiratory failure and Covid pneumonia and was admitted.  Significant Events: 8/5 COVID-19 positive  8/9 Admit to [facility] for syncope/hypoxia (3-4 L of oxygen) due to COVID-19 pneumonia 8/10 worsening hypoxia-up to 6 L of oxygen - Actemra ordered 8/10 lower extremity Doppler negative for DVT 8/10 TTE -EF 65-70% with no WMA or significant valvular abnormalities  Date of Positive COVID Test: 8/9 (confirmable in Epic)  Vaccination Status: Unvaccinated  COVID-19 specific Treatment: Steroid 8/9 > Remdesivir 8/9 > Actemra 8/9  Antimicrobials:  None  Subjective: Oxygen requirement has worsened over the last 24 hours with patient now requiring high flow nasal cannula support.  At the time my exam the patient is mildly tachypneic but does not appear to be in extremis.  He states he feels somewhat short of breath but is able to carry on a conversation.  He denies chest pain nausea or vomiting.  He does feel that he is slightly worse than yesterday.  Assessment & Plan:  COVID Pneumonia -acute hypoxic respiratory failure Dosed with Actemra 8/9 -continue remdesivir - with rapidly worsening hypoxia will increase solumedrol to ~2mg /kg total daily dose -with normal creatinine will attempt to gently diurese in hopes of aiding his hypoxia -clinically this patient is worrisome and has a high likelihood of declining further  Recent Labs  Lab 08/28/19 1524 08/28/19 1830 08/29/19 0757 08/30/19 0705  DDIMER  --   --  0.48 0.69*  FERRITIN  --  572* 589* 744*  CRP  --   --  8.4* 3.9*  ALT  42  --  39 33  PROCALCITON  --   --  <0.10  --     Syncope Felt to be either orthostatic or vasovagal -TTE noted EF 65-70% with hyperdynamic LV and no regional wall motion abnormalities  Hypomagnesemia Corrected with supplementation  Obesity - Body mass index is 32.32 kg/m.  DVT prophylaxis: Lovenox Code Status: FULL CODE Family Communication:  Status is: Inpatient  Remains inpatient appropriate because:Inpatient level of care appropriate due to severity of illness   Dispo: The patient is from: Home              Anticipated d/c is to: Home              Anticipated d/c date is: > 3 days              Patient currently is not medically stable to d/c.   Consultants:  none  Objective: Blood pressure (!) 137/92, pulse (!) 58, temperature 98 F (36.7 C), temperature source Oral, resp. rate (!) 22, height 6\' 1"  (1.854 m), weight 111.1 kg, SpO2 95 %.  Intake/Output Summary (Last 24 hours) at 08/30/2019 0859 Last data filed at 08/29/2019 1554 Gross per 24 hour  Intake 100 ml  Output --  Net 100 ml   Filed Weights   08/28/19 1516  Weight: 111.1 kg    Examination: General: Mild acute respiratory distress with some tachypnea but able to complete sentences Lungs: Crackles throughout all fields bilaterally with no wheezing Cardiovascular: Regular rate and rhythm without  murmur gallop or rub normal S1 and S2 Abdomen: Nontender, nondistended, soft, bowel sounds positive, no rebound, no ascites, no appreciable mass Extremities: No significant cyanosis, clubbing, or edema bilateral lower extremities  CBC: Recent Labs  Lab 08/28/19 1524 08/28/19 1524 08/29/19 0019 08/29/19 0757 08/30/19 0705  WBC 10.1  --   --  8.7 11.9*  NEUTROABS 8.7*  --   --  7.8*  --   HGB 14.9   < > 13.3 14.1 15.2  HCT 44.1   < > 39.0 43.1 46.0  MCV 84.3  --   --  86.2 87.0  PLT 238  --   --  230 290   < > = values in this interval not displayed.   Basic Metabolic Panel: Recent Labs  Lab  08/28/19 1524 08/28/19 1524 08/28/19 2037 08/29/19 0019 08/29/19 0757 08/30/19 0705  NA 141   < >  --  141 141 139  K 3.3*   < >  --  3.7 3.9 3.8  CL 105  --   --   --  104 101  CO2 23  --   --   --  26 26  GLUCOSE 118*  --   --   --  150* 157*  BUN 18  --   --   --  16 19  CREATININE 0.77   < > 0.78  --  0.65 0.65  CALCIUM 8.4*  --   --   --  8.3* 8.3*  MG 1.8  --   --   --  1.6* 2.3  PHOS 2.2*  --   --   --   --   --    < > = values in this interval not displayed.   GFR: Estimated Creatinine Clearance: 121.9 mL/min (by C-G formula based on SCr of 0.65 mg/dL).  Liver Function Tests: Recent Labs  Lab 08/28/19 1524 08/29/19 0757 08/30/19 0705  AST 51* 41 33  ALT 42 39 33  ALKPHOS 45 40 48  BILITOT 0.7 1.0 0.8  PROT 6.6 6.2* 6.2*  ALBUMIN 3.2* 2.8* 2.8*    Coagulation Profile: Recent Labs  Lab 08/28/19 1524  INR 1.0    HbA1C: Hgb A1c MFr Bld  Date/Time Value Ref Range Status  08/29/2019 07:14 AM 5.8 (H) 4.8 - 5.6 % Final    Comment:    (NOTE) Pre diabetes:          5.7%-6.4%  Diabetes:              >6.4%  Glycemic control for   <7.0% adults with diabetes     CBG: Recent Labs  Lab 08/29/19 0807 08/29/19 1247 08/29/19 1714 08/30/19 0702  GLUCAP 140* 129* 162* 165*    Recent Results (from the past 240 hour(s))  SARS Coronavirus 2 by RT PCR (hospital order, performed in [facility] hospital lab) Nasopharyngeal Nasopharyngeal Swab     Status: Abnormal   Collection Time: 08/28/19  3:24 PM   Specimen: Nasopharyngeal Swab  Result Value Ref Range Status   SARS Coronavirus 2 POSITIVE (A) NEGATIVE Final    Comment: RESULT CALLED TO, READ BACK BY AND VERIFIED WITH: D,SIDBURY @1715  08/28/19 EB (NOTE) SARS-CoV-2 target nucleic acids are DETECTED  SARS-CoV-2 RNA is generally detectable in upper respiratory specimens  during the acute phase of infection.  Positive results are indicative  of the presence of the identified virus, but do not rule  out bacterial infection or co-infection with other pathogens not detected by  the test.  Clinical correlation with patient history and  other diagnostic information is necessary to determine patient infection status.  The expected result is negative.  Fact Sheet for Patients:   BoilerBrush.com.cy   Fact Sheet for Healthcare Providers:   https://pope.com/    This test is not yet approved or cleared by the [place] FDA and  has been authorized for detection and/or diagnosis of SARS-CoV-2 by FDA under an Emergency Use Authorization (EUA).  This EUA will remain in effect (meaning this test can be  used) for the duration of  the COVID-19 declaration under Section 564(b)(1) of the Act, 21 U.S.C. section 360-bbb-3(b)(1), unless the authorization is terminated or revoked sooner.  Performed at [facility], [address] [place], [place] [address]   Culture, blood (Routine x 2)     Status: None (Preliminary result)   Collection Time: 08/28/19  3:24 PM   Specimen: BLOOD  Result Value Ref Range Status   Specimen Description BLOOD SITE NOT SPECIFIED  Final   Special Requests   Final    BOTTLES DRAWN AEROBIC AND ANAEROBIC Blood Culture adequate volume   Culture   Final    NO GROWTH < 24 HOURS Performed at [facility], [address] [place], [place] [address]    Report Status PENDING  Incomplete  Culture, blood (Routine x 2)     Status: None (Preliminary result)   Collection Time: 08/28/19  5:34 PM   Specimen: BLOOD  Result Value Ref Range Status   Specimen Description BLOOD SITE NOT SPECIFIED  Final   Special Requests   Final    BOTTLES DRAWN AEROBIC ONLY Blood Culture results may not be optimal due to an excessive volume of blood received in culture bottles   Culture   Final    NO GROWTH < 24 HOURS Performed at [facility], [address], [place], [place] [address]    Report Status PENDING  Incomplete   Urine culture     Status: None   Collection Time: 08/28/19  7:54 PM   Specimen: Urine, Random  Result Value Ref Range Status   Specimen Description URINE, RANDOM  Final   Special Requests NONE  Final   Culture   Final    NO GROWTH Performed at [facility], [address] [place], [place] [address]    Report Status 08/29/2019 FINAL  Final     Scheduled Meds: . benzonatate  200 mg Oral TID  . enoxaparin (LOVENOX) injection  55 mg Subcutaneous Q24H  . methylPREDNISolone (SOLU-MEDROL) injection  60 mg Intravenous Q12H   Continuous Infusions: . remdesivir 100 mg in NS 100 mL Stopped (08/29/19 1236)     LOS: 2 days   Lonia Blood, MD [facility] Office  [phone] Pager - Text Page per Loretha Stapler  If 7PM-7AM, please contact night-coverage per Amion 08/30/2019, 8:59 AM

## 2019-08-30 NOTE — ED Notes (Signed)
Pt. Assisted to bedside commode. Was able to have a BM. Complained of SOB once returning to bed.

## 2019-08-30 NOTE — ED Notes (Signed)
Jacob Page humble [phone] like an update as soon as you can . Very worried about her brother .

## 2019-08-30 NOTE — Progress Notes (Signed)
Patient placed on 15L high flow nasal cannula with sat only 87%. Non-rebreather placed on patient with sat of 93%. RT will continue to monitor patient.

## 2019-08-30 NOTE — ED Notes (Signed)
Patient sats dropped into mid to low 80s, pt with increased wob. RT made aware and requested to place patient on heated high flow.

## 2019-08-31 ENCOUNTER — Inpatient Hospital Stay (HOSPITAL_COMMUNITY): Payer: 59

## 2019-08-31 DIAGNOSIS — J1282 Pneumonia due to Coronavirus disease 2019: Secondary | ICD-10-CM | POA: Diagnosis not present

## 2019-08-31 DIAGNOSIS — U071 COVID-19: Secondary | ICD-10-CM | POA: Diagnosis not present

## 2019-08-31 DIAGNOSIS — J189 Pneumonia, unspecified organism: Secondary | ICD-10-CM | POA: Diagnosis not present

## 2019-08-31 LAB — FERRITIN: Ferritin: 1118 ng/mL — ABNORMAL HIGH (ref 24–336)

## 2019-08-31 LAB — CBC
HCT: 49.5 % (ref 39.0–52.0)
Hemoglobin: 16.4 g/dL (ref 13.0–17.0)
MCH: 28.3 pg (ref 26.0–34.0)
MCHC: 33.1 g/dL (ref 30.0–36.0)
MCV: 85.5 fL (ref 80.0–100.0)
Platelets: 383 10*3/uL (ref 150–400)
RBC: 5.79 MIL/uL (ref 4.22–5.81)
RDW: 13.1 % (ref 11.5–15.5)
WBC: 15.5 10*3/uL — ABNORMAL HIGH (ref 4.0–10.5)
nRBC: 0 % (ref 0.0–0.2)

## 2019-08-31 LAB — COMPREHENSIVE METABOLIC PANEL
ALT: 168 U/L — ABNORMAL HIGH (ref 0–44)
AST: 110 U/L — ABNORMAL HIGH (ref 15–41)
Albumin: 3.1 g/dL — ABNORMAL LOW (ref 3.5–5.0)
Alkaline Phosphatase: 58 U/L (ref 38–126)
Anion gap: 14 (ref 5–15)
BUN: 25 mg/dL — ABNORMAL HIGH (ref 8–23)
CO2: 30 mmol/L (ref 22–32)
Calcium: 8.7 mg/dL — ABNORMAL LOW (ref 8.9–10.3)
Chloride: 98 mmol/L (ref 98–111)
Creatinine, Ser: 0.94 mg/dL (ref 0.61–1.24)
GFR calc Af Amer: >60 mL/min (ref >60)
GFR calc non Af Amer: >60 mL/min (ref >60)
Glucose, Bld: 173 mg/dL — ABNORMAL HIGH (ref 70–99)
Potassium: 3.4 mmol/L — ABNORMAL LOW (ref 3.5–5.1)
Sodium: 142 mmol/L (ref 135–145)
Total Bilirubin: 1 mg/dL (ref 0.3–1.2)
Total Protein: 7 g/dL (ref 6.5–8.1)

## 2019-08-31 LAB — D-DIMER, QUANTITATIVE: D-Dimer, Quant: 1.13 ug/mL-FEU — ABNORMAL HIGH (ref 0.00–0.50)

## 2019-08-31 LAB — C-REACTIVE PROTEIN: CRP: 1.1 mg/dL — ABNORMAL HIGH (ref <1.0)

## 2019-08-31 LAB — GLUCOSE, CAPILLARY
Glucose-Capillary: 156 mg/dL — ABNORMAL HIGH (ref 70–99)
Glucose-Capillary: 156 mg/dL — ABNORMAL HIGH (ref 70–99)
Glucose-Capillary: 236 mg/dL — ABNORMAL HIGH (ref 70–99)
Glucose-Capillary: 174 mg/dL — ABNORMAL HIGH (ref 70–99)

## 2019-08-31 LAB — MAGNESIUM: Magnesium: 2.2 mg/dL (ref 1.7–2.4)

## 2019-08-31 MED ORDER — TRAMADOL HCL 50 MG PO TABS
50.0000 mg | ORAL_TABLET | Freq: Four times a day (QID) | ORAL | Status: DC | PRN
  Administered 2019-09-02 – 2019-09-03 (×2): 50 mg via ORAL
  Filled 2019-08-31 (×2): qty 1

## 2019-08-31 MED ORDER — POTASSIUM CHLORIDE CRYS ER 20 MEQ PO TBCR
20.0000 meq | EXTENDED_RELEASE_TABLET | Freq: Two times a day (BID) | ORAL | Status: AC
  Administered 2019-08-31 – 2019-09-01 (×3): 20 meq via ORAL
  Filled 2019-08-31 (×3): qty 1

## 2019-08-31 MED ORDER — BUTALBITAL-APAP-CAFFEINE 50-325-40 MG PO TABS
1.0000 | ORAL_TABLET | Freq: Four times a day (QID) | ORAL | Status: DC | PRN
  Administered 2019-08-31: 2 via ORAL
  Filled 2019-08-31: qty 2

## 2019-08-31 NOTE — Progress Notes (Signed)
Jacob Page  UKG:[id number] DOB: 1955-12-16 DOA: 08/28/2019 PCP: Paulina Fusi, MD    Brief Narrative:  64 year old with no significant medical history who was diagnosed with Covid 8/5 who presented with a 4 to 5-day history of progressively worsening shortness of breath and an episode of syncope. His wife had previously been hospitalized with Covid. In the ED he was found to be suffering with acute hypoxic respiratory failure and Covid pneumonia and was admitted.  Significant Events: 8/5 COVID-19 positive  8/9 Admit to [facility] for syncope/hypoxia (3-4 L of oxygen) due to COVID-19 pneumonia 8/10 worsening hypoxia-up to 6 L of oxygen - Actemra ordered 8/10 lower extremity Doppler negative for DVT 8/10 TTE -EF 65-70% with no WMA or significant valvular abnormalities  Date of Positive COVID Test: 8/9 (confirmable in Epic)  Vaccination Status: Unvaccinated  COVID-19 specific Treatment: Steroid 8/9 > Remdesivir 8/9 > Actemra 8/9  Antimicrobials:  None  Subjective: Saturations are in the low to mid 90s on high flow nasal cannula at 14 L.  Vital signs are otherwise stable.  On exam the patient appears to actually be more stable.  He denies any new complaints.  He is not in extremis.  Assessment & Plan:  COVID Pneumonia -acute hypoxic respiratory failure Dosed with Actemra 8/9 -continue remdesivir -continue Solu-Medrol at 1 mg/kg twice daily dosing for 3 days -scheduled diuretic being dosed - inflammatory markers trending upward -CXR today reviewed by this physician suggest worsening bilateral pulmonary infiltrates - clinically this patient is worrisome and has a high likelihood of declining further  Recent Labs  Lab 08/28/19 1524 08/28/19 1830 08/29/19 0757 08/30/19 0705 08/31/19 0744  DDIMER  --   --  0.48 0.69* 1.13*  FERRITIN  --  572* 589* 744* 1,118*  CRP  --   --  8.4* 3.9* 1.1*  ALT 42  --  39 33 168*  PROCALCITON  --   --  <0.10  --   --     Syncope Felt to be  either orthostatic or vasovagal -TTE noted EF 65-70% with hyperdynamic LV and no regional wall motion abnormalities  Mild transaminitis Felt to be due to Covid and treatment of same -follow trend  Recent Labs  Lab 08/28/19 1524 08/29/19 0757 08/30/19 0705 08/31/19 0744  AST 51* 41 33 110*  ALT 42 39 33 168*  ALKPHOS 45 40 48 58  BILITOT 0.7 1.0 0.8 1.0  PROT 6.6 6.2* 6.2* 7.0  ALBUMIN 3.2* 2.8* 2.8* 3.1*     Hypomagnesemia Corrected with supplementation  Obesity - Body mass index is 32.32 kg/m.  DVT prophylaxis: Lovenox Code Status: FULL CODE Family Communication:  Status is: Inpatient  Remains inpatient appropriate because:Inpatient level of care appropriate due to severity of illness   Dispo: The patient is from: Home              Anticipated d/c is to: Home              Anticipated d/c date is: > 3 days              Patient currently is not medically stable to d/c.   Consultants:  none  Objective: Blood pressure 114/73, pulse 70, temperature 98.4 F (36.9 C), temperature source Oral, resp. rate 20, height 6\' 1"  (1.854 m), weight 111.1 kg, SpO2 90 %.  Intake/Output Summary (Last 24 hours) at 08/31/2019 0947 Last data filed at 08/31/2019 0812 Gross per 24 hour  Intake 205.81 ml  Output 2475  ml  Net -2269.19 ml   Filed Weights   08/28/19 1516  Weight: 111.1 kg    Examination: General: Comfortable respirations though patient requiring high-level oxygen support Lungs: Crackles throughout all fields bilaterally -no wheezing Cardiovascular: RRR without murmur or rub Abdomen: NT/ND, soft, BS positive Extremities: No significant cyanosis, clubbing, or edema bilateral lower extremities  CBC: Recent Labs  Lab 08/28/19 1524 08/29/19 0019 08/29/19 0757 08/30/19 0705 08/31/19 0744  WBC 10.1  --  8.7 11.9* 15.5*  NEUTROABS 8.7*  --  7.8*  --   --   HGB 14.9   < > 14.1 15.2 16.4  HCT 44.1   < > 43.1 46.0 49.5  MCV 84.3  --  86.2 87.0 85.5  PLT 238  --   230 290 383   < > = values in this interval not displayed.   Basic Metabolic Panel: Recent Labs  Lab 08/28/19 1524 08/28/19 2037 08/29/19 0757 08/30/19 0705 08/31/19 0744  NA 141   < > 141 139 142  K 3.3*   < > 3.9 3.8 3.4*  CL 105  --  104 101 98  CO2 23  --  26 26 30   GLUCOSE 118*  --  150* 157* 173*  BUN 18  --  16 19 25*  CREATININE 0.77   < > 0.65 0.65 0.94  CALCIUM 8.4*  --  8.3* 8.3* 8.7*  MG 1.8  --  1.6* 2.3 2.2  PHOS 2.2*  --   --   --   --    < > = values in this interval not displayed.   GFR: Estimated Creatinine Clearance: 103.8 mL/min (by C-G formula based on SCr of 0.94 mg/dL).  Liver Function Tests: Recent Labs  Lab 08/28/19 1524 08/29/19 0757 08/30/19 0705 08/31/19 0744  AST 51* 41 33 110*  ALT 42 39 33 168*  ALKPHOS 45 40 48 58  BILITOT 0.7 1.0 0.8 1.0  PROT 6.6 6.2* 6.2* 7.0  ALBUMIN 3.2* 2.8* 2.8* 3.1*    Coagulation Profile: Recent Labs  Lab 08/28/19 1524  INR 1.0    HbA1C: Hgb A1c MFr Bld  Date/Time Value Ref Range Status  08/29/2019 07:14 AM 5.8 (H) 4.8 - 5.6 % Final    Comment:    (NOTE) Pre diabetes:          5.7%-6.4%  Diabetes:              >6.4%  Glycemic control for   <7.0% adults with diabetes     CBG: Recent Labs  Lab 08/29/19 1247 08/29/19 1714 08/30/19 0702 08/30/19 2055 08/31/19 0802  GLUCAP 129* 162* 165* 198* 156*    Recent Results (from the past 240 hour(s))  SARS Coronavirus 2 by RT PCR (hospital order, performed in [facility] hospital lab) Nasopharyngeal Nasopharyngeal Swab     Status: Abnormal   Collection Time: 08/28/19  3:24 PM   Specimen: Nasopharyngeal Swab  Result Value Ref Range Status   SARS Coronavirus 2 POSITIVE (A) NEGATIVE Final    Comment: RESULT CALLED TO, READ BACK BY AND VERIFIED WITH: D,SIDBURY @1715  08/28/19 EB (NOTE) SARS-CoV-2 target nucleic acids are DETECTED  SARS-CoV-2 RNA is generally detectable in upper respiratory specimens  during the acute phase of infection.   Positive results are indicative  of the presence of the identified virus, but do not rule out bacterial infection or co-infection with other pathogens not detected by the test.  Clinical correlation with patient history and  other diagnostic  information is necessary to determine patient infection status.  The expected result is negative.  Fact Sheet for Patients:   BoilerBrush.com.cy   Fact Sheet for Healthcare Providers:   https://pope.com/    This test is not yet approved or cleared by the [place] FDA and  has been authorized for detection and/or diagnosis of SARS-CoV-2 by FDA under an Emergency Use Authorization (EUA).  This EUA will remain in effect (meaning this test can be  used) for the duration of  the COVID-19 declaration under Section 564(b)(1) of the Act, 21 U.S.C. section 360-bbb-3(b)(1), unless the authorization is terminated or revoked sooner.  Performed at [facility], [address] [place], [place] [address]   Culture, blood (Routine x 2)     Status: None (Preliminary result)   Collection Time: 08/28/19  3:24 PM   Specimen: BLOOD  Result Value Ref Range Status   Specimen Description BLOOD SITE NOT SPECIFIED  Final   Special Requests   Final    BOTTLES DRAWN AEROBIC AND ANAEROBIC Blood Culture adequate volume   Culture   Final    NO GROWTH 3 DAYS Performed at [facility], [address], [place], [place] [address]    Report Status PENDING  Incomplete  Culture, blood (Routine x 2)     Status: None (Preliminary result)   Collection Time: 08/28/19  5:34 PM   Specimen: BLOOD  Result Value Ref Range Status   Specimen Description BLOOD SITE NOT SPECIFIED  Final   Special Requests   Final    BOTTLES DRAWN AEROBIC ONLY Blood Culture results may not be optimal due to an excessive volume of blood received in culture bottles   Culture   Final    NO GROWTH 3 DAYS Performed at [facility],  [address], [place], [place] [address]    Report Status PENDING  Incomplete  Urine culture     Status: None   Collection Time: 08/28/19  7:54 PM   Specimen: Urine, Random  Result Value Ref Range Status   Specimen Description URINE, RANDOM  Final   Special Requests NONE  Final   Culture   Final    NO GROWTH Performed at [facility], [address], [place], [place] [address]    Report Status 08/29/2019 FINAL  Final     Scheduled Meds: . benzonatate  200 mg Oral TID  . enoxaparin (LOVENOX) injection  55 mg Subcutaneous Q24H  . furosemide  40 mg Intravenous Q12H  . methylPREDNISolone (SOLU-MEDROL) injection  125 mg Intravenous Q12H   Continuous Infusions: . remdesivir 100 mg in NS 100 mL Stopped (08/30/19 1209)     LOS: 3 days   Lonia Blood, MD [facility] Office  [phone] Pager - Text Page per Loretha Stapler  If 7PM-7AM, please contact night-coverage per Amion 08/31/2019, 9:47 AM

## 2019-08-31 NOTE — Plan of Care (Signed)

## 2019-08-31 NOTE — Progress Notes (Addendum)
Patient stated he was having chest pain and it felt like his heart pounding. This RN explained types of pains to him. He stated that every time he goes to sleep, it feels like a "boom" and it goes away.   Patient is not in distress.  EKG taken. MD currently is at bedside.  03:51 AM: No further orders have been given.   08/31/19 0338  Vitals  Temp 98.3 F (36.8 C)  Temp Source Oral  BP 118/76  MAP (mmHg) 89  BP Location Right Arm  BP Method Automatic  Patient Position (if appropriate) Lying  Pulse Rate 64  Pulse Rate Source Monitor  ECG Heart Rate 66  Resp (!) 22  Level of Consciousness  Level of Consciousness Alert  Oxygen Therapy  SpO2 94 %  O2 Device HFNC  O2 Flow Rate (L/min) 13 L/min  Patient Activity (if Appropriate) In bed  Pain Assessment  Pain Scale 0-10  Pain Score 2  Pain Type Acute pain  Pain Location Chest  Pain Orientation Medial  Pain Radiating Towards  (no radiation)  Pain Descriptors / Indicators Other (Comment) (patient stated it was a "boom" feeling)  Pain Frequency Rarely  Pain Onset Awakened from sleep  Patients Stated Pain Goal 0  Pain Intervention(s) MD notified (Comment)

## 2019-09-01 DIAGNOSIS — J189 Pneumonia, unspecified organism: Secondary | ICD-10-CM | POA: Diagnosis not present

## 2019-09-01 DIAGNOSIS — U071 COVID-19: Secondary | ICD-10-CM | POA: Diagnosis not present

## 2019-09-01 DIAGNOSIS — J1282 Pneumonia due to Coronavirus disease 2019: Secondary | ICD-10-CM | POA: Diagnosis not present

## 2019-09-01 LAB — COMPREHENSIVE METABOLIC PANEL
ALT: 153 U/L — ABNORMAL HIGH (ref 0–44)
AST: 51 U/L — ABNORMAL HIGH (ref 15–41)
Albumin: 3 g/dL — ABNORMAL LOW (ref 3.5–5.0)
Alkaline Phosphatase: 55 U/L (ref 38–126)
Anion gap: 13 (ref 5–15)
BUN: 31 mg/dL — ABNORMAL HIGH (ref 8–23)
CO2: 30 mmol/L (ref 22–32)
Calcium: 8.7 mg/dL — ABNORMAL LOW (ref 8.9–10.3)
Chloride: 98 mmol/L (ref 98–111)
Creatinine, Ser: 0.98 mg/dL (ref 0.61–1.24)
GFR calc Af Amer: >60 mL/min (ref >60)
GFR calc non Af Amer: >60 mL/min (ref >60)
Glucose, Bld: 196 mg/dL — ABNORMAL HIGH (ref 70–99)
Potassium: 3.9 mmol/L (ref 3.5–5.1)
Sodium: 141 mmol/L (ref 135–145)
Total Bilirubin: 1.1 mg/dL (ref 0.3–1.2)
Total Protein: 6.4 g/dL — ABNORMAL LOW (ref 6.5–8.1)

## 2019-09-01 LAB — C-REACTIVE PROTEIN: CRP: 0.7 mg/dL (ref <1.0)

## 2019-09-01 LAB — CBC
HCT: 48 % (ref 39.0–52.0)
Hemoglobin: 15.9 g/dL (ref 13.0–17.0)
MCH: 28.6 pg (ref 26.0–34.0)
MCHC: 33.1 g/dL (ref 30.0–36.0)
MCV: 86.5 fL (ref 80.0–100.0)
Platelets: 400 10*3/uL (ref 150–400)
RBC: 5.55 MIL/uL (ref 4.22–5.81)
RDW: 13 % (ref 11.5–15.5)
WBC: 15.9 10*3/uL — ABNORMAL HIGH (ref 4.0–10.5)
nRBC: 0 % (ref 0.0–0.2)

## 2019-09-01 LAB — D-DIMER, QUANTITATIVE: D-Dimer, Quant: 1.06 ug/mL-FEU — ABNORMAL HIGH (ref 0.00–0.50)

## 2019-09-01 LAB — GLUCOSE, CAPILLARY
Glucose-Capillary: 160 mg/dL — ABNORMAL HIGH (ref 70–99)
Glucose-Capillary: 183 mg/dL — ABNORMAL HIGH (ref 70–99)
Glucose-Capillary: 201 mg/dL — ABNORMAL HIGH (ref 70–99)
Glucose-Capillary: 238 mg/dL — ABNORMAL HIGH (ref 70–99)

## 2019-09-01 LAB — FERRITIN: Ferritin: 701 ng/mL — ABNORMAL HIGH (ref 24–336)

## 2019-09-01 MED ORDER — METHYLPREDNISOLONE SODIUM SUCC 125 MG IJ SOLR: 60.0000 mg | Freq: Two times a day (BID) | INTRAMUSCULAR | Status: DC

## 2019-09-01 MED ORDER — INSULIN ASPART 100 UNIT/ML ~~LOC~~ SOLN
0.0000 [IU] | Freq: Three times a day (TID) | SUBCUTANEOUS | Status: DC
  Administered 2019-09-02 (×2): 1 [IU] via SUBCUTANEOUS
  Administered 2019-09-02: 2 [IU] via SUBCUTANEOUS
  Administered 2019-09-03: 1 [IU] via SUBCUTANEOUS
  Administered 2019-09-03 – 2019-09-04 (×3): 2 [IU] via SUBCUTANEOUS
  Administered 2019-09-04 – 2019-09-05 (×3): 1 [IU] via SUBCUTANEOUS
  Administered 2019-09-06 – 2019-09-07 (×4): 2 [IU] via SUBCUTANEOUS
  Administered 2019-09-08: 1 [IU] via SUBCUTANEOUS

## 2019-09-01 MED ORDER — INSULIN ASPART 100 UNIT/ML ~~LOC~~ SOLN
0.0000 [IU] | Freq: Every day | SUBCUTANEOUS | Status: DC
  Administered 2019-09-01 – 2019-09-02 (×2): 2 [IU] via SUBCUTANEOUS

## 2019-09-01 NOTE — Progress Notes (Signed)
Jacob Page V Ales  ZOX:[id number] DOB: 06-22-1955 DOA: 08/28/2019 PCP: Paulina FusiSchultz, Douglas E, MD    Brief Narrative:  64 year old with no significant medical history who was diagnosed with Covid 8/5 who presented with a 4 to 5-day history of progressively worsening shortness of breath and an episode of syncope. His wife had previously been hospitalized with Covid. In the ED he was found to be suffering with acute hypoxic respiratory failure and Covid pneumonia and was admitted.  Significant Events: 8/5 COVID-19 positive  8/9 [facility] syncope/hypoxia (3-4 L of oxygen) due to COVID-19 pneumonia 8/10 worsening hypoxia-up to 6 L of oxygen - Actemra ordered 8/10 lower extremity Doppler negative for DVT 8/10 TTE -EF 65-70% with no WMA or significant valvular abnormalities  Date of Positive COVID Test: 8/9 (confirmable in Epic)  Vaccination Status: Unvaccinated  COVID-19 specific Treatment: Steroid 8/9 > Remdesivir 8/9 > Actemra 8/9  Antimicrobials:  None  Subjective: Continues to require 15 L high flow nasal cannula support with saturations in the mid to low 80s.  Inflammatory markers have improved today.  Labs remain stable otherwise.  Patient appears stable on exam and is not in extremis.  Respirations appear comfortable.  No chest pain nausea or vomiting.  Assessment & Plan:  COVID Pneumonia -acute hypoxic respiratory failure Dosed with Actemra 8/9 -continue remdesivir -continue Solu-Medrol at 1 mg/kg twice daily dosing for 3 days -scheduled diuretic being dosed - inflammatory markers improved today - CXR 8/12 noted worsening bilateral pulmonary infiltrates - clinically this patient remains worrisome and has a high likelihood of declining further  Recent Labs  Lab 08/28/19 1524 08/28/19 1830 08/29/19 0757 08/30/19 0705 08/31/19 0744 09/01/19 0349  DDIMER  --   --  0.48 0.69* 1.13* 1.06*  FERRITIN  --  572* 589* 744* 1,118* 701*  CRP  --   --  8.4* 3.9* 1.1* 0.7  ALT 42  --   39 33 168* 153*  PROCALCITON  --   --  <0.10  --   --   --     Syncope Felt to be either orthostatic or vasovagal -TTE noted EF 65-70% with hyperdynamic LV and no regional wall motion abnormalities  Mild transaminitis Felt to be due to Covid and treatment of same -follow trend  Recent Labs  Lab 08/28/19 1524 08/29/19 0757 08/30/19 0705 08/31/19 0744 09/01/19 0349  AST 51* 41 33 110* 51*  ALT 42 39 33 168* 153*  ALKPHOS 45 40 48 58 55  BILITOT 0.7 1.0 0.8 1.0 1.1  PROT 6.6 6.2* 6.2* 7.0 6.4*  ALBUMIN 3.2* 2.8* 2.8* 3.1* 3.0*     Hypomagnesemia Corrected with supplementation  Steroid-induced hyperglycemia No prior history of DM and A1c not consistent with DM -trend CBGs and administer insulin as needed if CBGs consistently greater than 200  Obesity - Body mass index is 32.32 kg/m.  DVT prophylaxis: Lovenox Code Status: FULL CODE Family Communication:  Status is: Inpatient  Remains inpatient appropriate because:Inpatient level of care appropriate due to severity of illness   Dispo: The patient is from: Home              Anticipated d/c is to: Home              Anticipated d/c date is: > 3 days              Patient currently is not medically stable to d/c.   Consultants:  none  Objective: Blood pressure 126/78, pulse 61, temperature  98.5 F (36.9 C), temperature source Oral, resp. rate 18, height 6\' 1"  (1.854 m), weight 111.1 kg, SpO2 (!) 84 %.  Intake/Output Summary (Last 24 hours) at 09/01/2019 0902 Last data filed at 09/01/2019 0700 Gross per 24 hour  Intake 580 ml  Output 1950 ml  Net -1370 ml   Filed Weights   08/28/19 1516  Weight: 111.1 kg    Examination: General: Not in extremis Lungs: Crackles throughout all fields bilaterally without wheezing Cardiovascular: RRR without murmur or rub Abdomen: NT/ND, soft, BS positive Extremities: No C/C/E B LE  CBC: Recent Labs  Lab 08/28/19 1524 08/29/19 0019 08/29/19 0757 08/29/19 0757  08/30/19 0705 08/31/19 0744 09/01/19 0349  WBC 10.1  --  8.7   < > 11.9* 15.5* 15.9*  NEUTROABS 8.7*  --  7.8*  --   --   --   --   HGB 14.9   < > 14.1   < > 15.2 16.4 15.9  HCT 44.1   < > 43.1   < > 46.0 49.5 48.0  MCV 84.3  --  86.2   < > 87.0 85.5 86.5  PLT 238  --  230   < > 290 383 400   < > = values in this interval not displayed.   Basic Metabolic Panel: Recent Labs  Lab 08/28/19 1524 08/28/19 2037 08/29/19 0757 08/29/19 0757 08/30/19 0705 08/31/19 0744 09/01/19 0349  NA 141   < > 141   < > 139 142 141  K 3.3*   < > 3.9   < > 3.8 3.4* 3.9  CL 105  --  104   < > 101 98 98  CO2 23  --  26   < > 26 30 30   GLUCOSE 118*  --  150*   < > 157* 173* 196*  BUN 18  --  16   < > 19 25* 31*  CREATININE 0.77   < > 0.65   < > 0.65 0.94 0.98  CALCIUM 8.4*  --  8.3*   < > 8.3* 8.7* 8.7*  MG 1.8  --  1.6*  --  2.3 2.2  --   PHOS 2.2*  --   --   --   --   --   --    < > = values in this interval not displayed.   GFR: Estimated Creatinine Clearance: 99.5 mL/min (by C-G formula based on SCr of 0.98 mg/dL).  Liver Function Tests: Recent Labs  Lab 08/29/19 0757 08/30/19 0705 08/31/19 0744 09/01/19 0349  AST 41 33 110* 51*  ALT 39 33 168* 153*  ALKPHOS 40 48 58 55  BILITOT 1.0 0.8 1.0 1.1  PROT 6.2* 6.2* 7.0 6.4*  ALBUMIN 2.8* 2.8* 3.1* 3.0*    Coagulation Profile: Recent Labs  Lab 08/28/19 1524  INR 1.0    HbA1C: Hgb A1c MFr Bld  Date/Time Value Ref Range Status  08/29/2019 07:14 AM 5.8 (H) 4.8 - 5.6 % Final    Comment:    (NOTE) Pre diabetes:          5.7%-6.4%  Diabetes:              >6.4%  Glycemic control for   <7.0% adults with diabetes     CBG: Recent Labs  Lab 08/31/19 0802 08/31/19 1210 08/31/19 1618 08/31/19 2059 09/01/19 0738  GLUCAP 156* 156* 236* 174* 160*    Recent Results (from the past 240 hour(s))  SARS Coronavirus 2 by  RT PCR (hospital order, performed in [facility] hospital lab) Nasopharyngeal Nasopharyngeal Swab     Status:  Abnormal   Collection Time: 08/28/19  3:24 PM   Specimen: Nasopharyngeal Swab  Result Value Ref Range Status   SARS Coronavirus 2 POSITIVE (A) NEGATIVE Final    Comment: RESULT CALLED TO, READ BACK BY AND VERIFIED WITH: D,SIDBURY @1715  08/28/19 EB (NOTE) SARS-CoV-2 target nucleic acids are DETECTED  SARS-CoV-2 RNA is generally detectable in upper respiratory specimens  during the acute phase of infection.  Positive results are indicative  of the presence of the identified virus, but do not rule out bacterial infection or co-infection with other pathogens not detected by the test.  Clinical correlation with patient history and  other diagnostic information is necessary to determine patient infection status.  The expected result is negative.  Fact Sheet for Patients:   10/28/19   Fact Sheet for Healthcare Providers:   BoilerBrush.com.cy    This test is not yet approved or cleared by the https://pope.com/ FDA and  has been authorized for detection and/or diagnosis of SARS-CoV-2 by FDA under an Emergency Use Authorization (EUA).  This EUA will remain in effect (meaning this test can be  used) for the duration of  the COVID-19 declaration under Section 564(b)(1) of the Act, 21 U.S.C. section 360-bbb-3(b)(1), unless the authorization is terminated or revoked sooner.  Performed at [facility], [address] [place], [place]   Culture, blood (Routine x 2)     Status: None (Preliminary result)   Collection Time: 08/28/19  3:24 PM   Specimen: BLOOD  Result Value Ref Range Status   Specimen Description BLOOD SITE NOT SPECIFIED  Final   Special Requests   Final    BOTTLES DRAWN AEROBIC AND ANAEROBIC Blood Culture adequate volume   Culture   Final    NO GROWTH 4 DAYS Performed at [facility], [address], [place], [place]    Report Status PENDING  Incomplete  Culture, blood (Routine x 2)      Status: None (Preliminary result)   Collection Time: 08/28/19  5:34 PM   Specimen: BLOOD  Result Value Ref Range Status   Specimen Description BLOOD SITE NOT SPECIFIED  Final   Special Requests   Final    BOTTLES DRAWN AEROBIC ONLY Blood Culture results may not be optimal due to an excessive volume of blood received in culture bottles   Culture   Final    NO GROWTH 4 DAYS Performed at [facility], [address] [place], [place]    Report Status PENDING  Incomplete  Urine culture     Status: None   Collection Time: 08/28/19  7:54 PM   Specimen: Urine, Random  Result Value Ref Range Status   Specimen Description URINE, RANDOM  Final   Special Requests NONE  Final   Culture   Final    NO GROWTH Performed at [facility], [address], [place], [place]    Report Status 08/29/2019 FINAL  Final     Scheduled Meds: . benzonatate  200 mg Oral TID  . enoxaparin (LOVENOX) injection  55 mg Subcutaneous Q24H  . methylPREDNISolone (SOLU-MEDROL) injection  125 mg Intravenous Q12H  . potassium chloride  20 mEq Oral BID      LOS: 4 days   10/29/2019, MD [facility] Office  [phone] Pager - Text Page per [phone]  If  7PM-7AM, please contact night-coverage per Amion 09/01/2019, 9:02 AM

## 2019-09-02 DIAGNOSIS — U071 COVID-19: Secondary | ICD-10-CM | POA: Diagnosis not present

## 2019-09-02 LAB — CBC
HCT: 47.3 % (ref 39.0–52.0)
Hemoglobin: 15.7 g/dL (ref 13.0–17.0)
MCH: 28.3 pg (ref 26.0–34.0)
MCHC: 33.2 g/dL (ref 30.0–36.0)
MCV: 85.2 fL (ref 80.0–100.0)
Platelets: 417 10*3/uL — ABNORMAL HIGH (ref 150–400)
RBC: 5.55 MIL/uL (ref 4.22–5.81)
RDW: 12.7 % (ref 11.5–15.5)
WBC: 16.7 10*3/uL — ABNORMAL HIGH (ref 4.0–10.5)
nRBC: 0 % (ref 0.0–0.2)

## 2019-09-02 LAB — CULTURE, BLOOD (ROUTINE X 2)
Culture: NO GROWTH
Culture: NO GROWTH
Special Requests: ADEQUATE

## 2019-09-02 LAB — COMPREHENSIVE METABOLIC PANEL WITH GFR
ALT: 97 U/L — ABNORMAL HIGH (ref 0–44)
AST: 33 U/L (ref 15–41)
Albumin: 2.8 g/dL — ABNORMAL LOW (ref 3.5–5.0)
Alkaline Phosphatase: 48 U/L (ref 38–126)
Anion gap: 9 (ref 5–15)
BUN: 29 mg/dL — ABNORMAL HIGH (ref 8–23)
CO2: 29 mmol/L (ref 22–32)
Calcium: 8.5 mg/dL — ABNORMAL LOW (ref 8.9–10.3)
Chloride: 100 mmol/L (ref 98–111)
Creatinine, Ser: 0.84 mg/dL (ref 0.61–1.24)
GFR calc Af Amer: >60 mL/min (ref >60)
GFR calc non Af Amer: >60 mL/min (ref >60)
Glucose, Bld: 142 mg/dL — ABNORMAL HIGH (ref 70–99)
Potassium: 4.1 mmol/L (ref 3.5–5.1)
Sodium: 138 mmol/L (ref 135–145)
Total Bilirubin: 1.2 mg/dL (ref 0.3–1.2)
Total Protein: 5.8 g/dL — ABNORMAL LOW (ref 6.5–8.1)

## 2019-09-02 LAB — GLUCOSE, CAPILLARY
Glucose-Capillary: 162 mg/dL — ABNORMAL HIGH (ref 70–99)
Glucose-Capillary: 139 mg/dL — ABNORMAL HIGH (ref 70–99)
Glucose-Capillary: 122 mg/dL — ABNORMAL HIGH (ref 70–99)
Glucose-Capillary: 230 mg/dL — ABNORMAL HIGH (ref 70–99)

## 2019-09-02 LAB — D-DIMER, QUANTITATIVE: D-Dimer, Quant: 1.24 ug/mL-FEU — ABNORMAL HIGH (ref 0.00–0.50)

## 2019-09-02 LAB — C-REACTIVE PROTEIN: CRP: 1.1 mg/dL — ABNORMAL HIGH (ref <1.0)

## 2019-09-02 MED ORDER — METHYLPREDNISOLONE SODIUM SUCC 125 MG IJ SOLR
60.0000 mg | Freq: Every day | INTRAMUSCULAR | Status: DC
  Administered 2019-09-02 – 2019-09-07 (×6): 60 mg via INTRAVENOUS
  Filled 2019-09-02 (×6): qty 2

## 2019-09-02 NOTE — Progress Notes (Signed)
Jacob Page  ZDG:[id number] DOB: [birth year] DOA: 08/28/2019 PCP: Paulina Fusi, MD    Brief Narrative:  64 year old with no significant medical history who was diagnosed with Covid 8/5 who presented with a 4 to 5-day history of progressively worsening shortness of breath and an episode of syncope. His wife had previously been hospitalized with Covid. In the ED he was found to be suffering with acute hypoxic respiratory failure and Covid pneumonia and was admitted.  Significant Events: 8/5 COVID-19 positive  8/9 Admit to [facility] for syncope/hypoxia (3-4 L of oxygen) due to COVID-19 pneumonia 8/10 worsening hypoxia-up to 6 L of oxygen - Actemra ordered 8/10 lower extremity Doppler negative for DVT 8/10 TTE -EF 65-70% with no WMA or significant valvular abnormalities  Date of Positive COVID Test: 8/9 (confirmable in Epic)  Vaccination Status: Unvaccinated  COVID-19 specific Treatment: Steroid 8/9 > Remdesivir 8/9 > Actemra 8/9  Antimicrobials:  None  Subjective:  Patient in bed, appears comfortable, denies any headache, no fever, no chest pain or pressure, +ve shortness of breath , no abdominal pain. No focal weakness.  Assessment & Plan:  COVID Pneumonia -acute hypoxic respiratory failure - unfortunately he is unvaccinated and got severe disease, he has been treated with Actemra, IV steroids and remdesivir, inflammatory markers have stabilized although he is still quite tenuous and requiring large amounts of oxygen.  Continue to gently taper down steroids, advance activity, encouraged to sit up in chair in daytime and use I-S and flutter valve for pulmonary toiletry.  Recent Labs  Lab 08/28/19 1524 08/28/19 1830 08/29/19 0757 08/30/19 0705 08/31/19 0744 09/01/19 0349  DDIMER  --   --  0.48 0.69* 1.13* 1.06*  FERRITIN  --  572* 589* 744* 1,118* 701*  CRP  --   --  8.4* 3.9* 1.1* 0.7  ALT 42  --  39 33 168* 153*  PROCALCITON  --   --  <0.10  --   --   --    SpO2: (!)  85 % O2 Flow Rate (L/min): 14 L/min   Syncope Felt to be either orthostatic or vasovagal -TTE noted EF 65-70% with hyperdynamic LV and no regional wall motion abnormalities  Mild transaminitis Felt to be due to Covid and treatment of same -follow trend  Recent Labs  Lab 08/28/19 1524 08/29/19 0757 08/30/19 0705 08/31/19 0744 09/01/19 0349  AST 51* 41 33 110* 51*  ALT 42 39 33 168* 153*  ALKPHOS 45 40 48 58 55  BILITOT 0.7 1.0 0.8 1.0 1.1  PROT 6.6 6.2* 6.2* 7.0 6.4*  ALBUMIN 3.2* 2.8* 2.8* 3.1* 3.0*     Hypomagnesemia Corrected with supplementation  Steroid-induced hyperglycemia No prior history of DM and A1c not consistent with DM -trend CBGs and administer insulin as needed if CBGs consistently greater than 200  Obesity - Body mass index is 32.32 kg/m.    DVT prophylaxis: Lovenox Code Status: FULL CODE Family Communication:  Status is: Inpatient  Remains inpatient appropriate because:Inpatient level of care appropriate due to severity of illness   Dispo: The patient is from: Home              Anticipated d/c is to: Home              Anticipated d/c date is: > 3 days              Patient currently is not medically stable to d/c.   Consultants:  none  Objective: Blood pressure 119/75, pulse  68, temperature 97.7 F (36.5 C), temperature source Oral, resp. rate (!) 24, height 6\' 1"  (1.854 m), weight 111.1 kg, SpO2 (!) 85 %.  Intake/Output Summary (Last 24 hours) at 09/02/2019 1435 Last data filed at 09/02/2019 1421 Gross per 24 hour  Intake --  Output 950 ml  Net -950 ml   Filed Weights   08/28/19 1516  Weight: 111.1 kg    Examination:  Awake Alert, No new F.N deficits, Normal affect [place].AT,PERRAL Supple Neck,No JVD, No cervical lymphadenopathy appriciated.  Symmetrical Chest wall movement, Good air movement bilaterally, CTAB RRR,No Gallops, Rubs or new Murmurs, No Parasternal Heave +ve B.Sounds, Abd Soft, No tenderness, No organomegaly  appriciated, No rebound - guarding or rigidity. No Cyanosis, Clubbing or edema, No new Rash or bruise   CBC: Recent Labs  Lab 08/28/19 1524 08/29/19 0019 08/29/19 0757 08/29/19 0757 08/30/19 0705 08/31/19 0744 09/01/19 0349  WBC 10.1  --  8.7   < > 11.9* 15.5* 15.9*  NEUTROABS 8.7*  --  7.8*  --   --   --   --   HGB 14.9   < > 14.1   < > 15.2 16.4 15.9  HCT 44.1   < > 43.1   < > 46.0 49.5 48.0  MCV 84.3  --  86.2   < > 87.0 85.5 86.5  PLT 238  --  230   < > 290 383 400   < > = values in this interval not displayed.   Basic Metabolic Panel: Recent Labs  Lab 08/28/19 1524 08/28/19 2037 08/29/19 0757 08/29/19 0757 08/30/19 0705 08/31/19 0744 09/01/19 0349  NA 141   < > 141   < > 139 142 141  K 3.3*   < > 3.9   < > 3.8 3.4* 3.9  CL 105  --  104   < > 101 98 98  CO2 23  --  26   < > 26 30 30   GLUCOSE 118*  --  150*   < > 157* 173* 196*  BUN 18  --  16   < > 19 25* 31*  CREATININE 0.77   < > 0.65   < > 0.65 0.94 0.98  CALCIUM 8.4*  --  8.3*   < > 8.3* 8.7* 8.7*  MG 1.8  --  1.6*  --  2.3 2.2  --   PHOS 2.2*  --   --   --   --   --   --    < > = values in this interval not displayed.   GFR: Estimated Creatinine Clearance: 99.5 mL/min (by C-G formula based on SCr of 0.98 mg/dL).  Liver Function Tests: Recent Labs  Lab 08/29/19 0757 08/30/19 0705 08/31/19 0744 09/01/19 0349  AST 41 33 110* 51*  ALT 39 33 168* 153*  ALKPHOS 40 48 58 55  BILITOT 1.0 0.8 1.0 1.1  PROT 6.2* 6.2* 7.0 6.4*  ALBUMIN 2.8* 2.8* 3.1* 3.0*    Coagulation Profile: Recent Labs  Lab 08/28/19 1524  INR 1.0    HbA1C: Hgb A1c MFr Bld  Date/Time Value Ref Range Status  08/29/2019 07:14 AM 5.8 (H) 4.8 - 5.6 % Final    Comment:    (NOTE) Pre diabetes:          5.7%-6.4%  Diabetes:              >6.4%  Glycemic control for   <7.0% adults with diabetes     CBG:  Recent Labs  Lab 09/01/19 1145 09/01/19 1705 09/01/19 2059 09/02/19 0747 09/02/19 1216  GLUCAP 183* 201* 238* 162*  139*    Recent Results (from the past 240 hour(s))  SARS Coronavirus 2 by RT PCR (hospital order, performed in [facility] hospital lab) Nasopharyngeal Nasopharyngeal Swab     Status: Abnormal   Collection Time: 08/28/19  3:24 PM   Specimen: Nasopharyngeal Swab  Result Value Ref Range Status   SARS Coronavirus 2 POSITIVE (A) NEGATIVE Final    Comment: RESULT CALLED TO, READ BACK BY AND VERIFIED WITH: D,SIDBURY @1715  08/28/19 EB (NOTE) SARS-CoV-2 target nucleic acids are DETECTED  SARS-CoV-2 RNA is generally detectable in upper respiratory specimens  during the acute phase of infection.  Positive results are indicative  of the presence of the identified virus, but do not rule out bacterial infection or co-infection with other pathogens not detected by the test.  Clinical correlation with patient history and  other diagnostic information is necessary to determine patient infection status.  The expected result is negative.  Fact Sheet for Patients:   BoilerBrush.com.cyhttps://www.fda.gov/media/136312/download   Fact Sheet for Healthcare Providers:   https://pope.com/https://www.fda.gov/media/136313/download    This test is not yet approved or cleared by the [place] FDA and  has been authorized for detection and/or diagnosis of SARS-CoV-2 by FDA under an Emergency Use Authorization (EUA).  This EUA will remain in effect (meaning this test can be  used) for the duration of  the COVID-19 declaration under Section 564(b)(1) of the Act, 21 U.S.C. section 360-bbb-3(b)(1), unless the authorization is terminated or revoked sooner.  Performed at [facility], [address] [place], [place] [address]   Culture, blood (Routine x 2)     Status: None   Collection Time: 08/28/19  3:24 PM   Specimen: BLOOD  Result Value Ref Range Status   Specimen Description BLOOD SITE NOT SPECIFIED  Final   Special Requests   Final    BOTTLES DRAWN AEROBIC AND ANAEROBIC Blood Culture adequate volume   Culture   Final    NO  GROWTH 5 DAYS Performed at [facility], [address] [place], [place] [address]    Report Status 09/02/2019 FINAL  Final  Culture, blood (Routine x 2)     Status: None   Collection Time: 08/28/19  5:34 PM   Specimen: BLOOD  Result Value Ref Range Status   Specimen Description BLOOD SITE NOT SPECIFIED  Final   Special Requests   Final    BOTTLES DRAWN AEROBIC ONLY Blood Culture results may not be optimal due to an excessive volume of blood received in culture bottles   Culture   Final    NO GROWTH 5 DAYS Performed at [facility], [address] [place], [place] [address]    Report Status 09/02/2019 FINAL  Final  Urine culture     Status: None   Collection Time: 08/28/19  7:54 PM   Specimen: Urine, Random  Result Value Ref Range Status   Specimen Description URINE, RANDOM  Final   Special Requests NONE  Final   Culture   Final    NO GROWTH Performed at [facility], [address] [place], [place] [address]    Report Status 08/29/2019 FINAL  Final     Scheduled Meds:  benzonatate  200 mg Oral TID   enoxaparin (LOVENOX) injection  55 mg Subcutaneous Q24H   insulin aspart  0-5 Units Subcutaneous QHS   insulin  aspart  0-9 Units Subcutaneous TID WC   methylPREDNISolone (SOLU-MEDROL) injection  60 mg Intravenous Q12H      LOS: 5 days   Signature  Susa Raring M.D on 09/02/2019 at 2:35 PM   -  To page go to [email or web address]

## 2019-09-02 NOTE — Evaluation (Signed)
Physical Therapy Evaluation Patient Details Name: Jacob Page MRN: [id number] DOB: 11-27-1955 Today's Date: 09/02/2019   History of Present Illness  Pt is a 64 y.o. male diagnoses with COVID-19 (pt unvaccinated) on 08/24/19 now admitted 08/28/19 with worsening SOB and syncopal episode. Workup for acute hypoxic respiratory failure and COVID PNA. Negative DVT. CXR 8/12 with worsening bilateral pulmonary infiltrates. PMH includes OSA. Of note, pt's wife previously hospitalized with COVID.    Clinical Impression  Pt presents with an overall decrease in functional mobility secondary to above. PTA, pt independent, works and lives at home with wife who is currently also recovering from COVID. Today, pt able to ambulate in room with intermittent min guard for balance. SpO2 down to 81% on 15 L O2 HFNC; pt requires prolonged seated rest and pursed lip breathing to recover SpO2. Reviewed educ re: activity recommendations (sitting upright in recliner or at EOB often), incentive spirometer use, importance of mobility. Pt would benefit from continued acute PT services to maximize functional mobility and independence prior to d/c home.    Follow Up Recommendations No PT follow up;Supervision - Intermittent    Equipment Recommendations   (likely home O2, shower seat?)    Recommendations for Other Services       Precautions / Restrictions Precautions Precautions: Fall;Other (comment) Precaution Comments: Quick to desaturate on 15L HFNC Restrictions Weight Bearing Restrictions: No      Mobility  Bed Mobility Overal bed mobility: Modified Independent             General bed mobility comments: Return to supine, cues to manage lines  Transfers Overall transfer level: Needs assistance Equipment used: None Transfers: Sit to/from Stand Sit to Stand: Min guard         General transfer comment: Able to perform multiple sit<>stands from recliner and EOB without DME, min guard for balance upon  initial standing  Ambulation/Gait Ambulation/Gait assistance: Min guard Gait Distance (Feet): 60 Feet Assistive device: None Gait Pattern/deviations: Step-through pattern;Decreased stride length Gait velocity: Decreased   General Gait Details: Slow, guarded, mildly unsteady gait without DME, 1x self-corrected LOB with wall support, min guard for balance; x1 seated rest break secondary to SpO2 down to 81% on 15L O2 HFNC, recovering to 87-89% with seated rest and pursed lip breathing  Stairs            Wheelchair Mobility    Modified Rankin (Stroke Patients Only)       Balance Overall balance assessment: Needs assistance Sitting-balance support: No upper extremity supported;Feet supported Sitting balance-Leahy Scale: Good       Standing balance-Leahy Scale: Fair Standing balance comment: Can static stand and walk without UE support                             Pertinent Vitals/Pain Pain Assessment: Faces Faces Pain Scale: Hurts little more Pain Location: Headache Pain Descriptors / Indicators: Headache Pain Intervention(s): Patient requesting pain meds-RN notified    Home Living Family/patient expects to be discharged to:: Private residence Living Arrangements: Spouse/significant other Available Help at Discharge: Family;Available PRN/intermittently Type of Home: House Home Access: Stairs to enter Entrance Stairs-Rails: Right Entrance Stairs-Number of Steps: 4 Home Layout: One level;Laundry or work area in [employer]: None Additional Comments: Daughter helping wife who is at home sick with COVID    Prior Function Level of Independence: Independent         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     Hand Dominance        Extremity/Trunk Assessment   Upper Extremity Assessment Upper Extremity Assessment: Overall WFL for tasks assessed    Lower Extremity Assessment Lower Extremity  Assessment: Overall WFL for tasks assessed    Cervical / Trunk Assessment Cervical / Trunk Assessment: Normal  Communication   Communication: No difficulties  Cognition Arousal/Alertness: Awake/alert Behavior During Therapy: WFL for tasks assessed/performed Overall Cognitive Status: Within Functional Limits for tasks assessed                                 General Comments: WFL for simple tasks, not formally assessed      General Comments General comments (skin integrity, edema, etc.): SpO2 down to 81% on 15L O2 HFNC, pulse ox probe switched from finger to ear and got better reading. recovering to 89% with prolonged seated rest and pursed lip breathing    Exercises Other Exercises Other Exercises: Incentive spirometer x5, pt able to pull 1250 mL, resulted in significant productive coughing. Pt endorses lightheadedness with that much coughing   Assessment/Plan    PT Assessment Patient needs continued PT services  PT Problem List Decreased activity tolerance;Decreased balance;Decreased mobility;Cardiopulmonary status limiting activity       PT Treatment Interventions Gait training;Stair training;Functional mobility training;Therapeutic activities;DME instruction;Therapeutic exercise;Balance training;Patient/family education    PT Goals (Current goals can be found in the Care Plan section)  Acute Rehab PT Goals Patient Stated Goal: Return home PT Goal Formulation: With patient Time For Goal Achievement: 09/16/19 Potential to Achieve Goals: Good    Frequency Min 3X/week   Barriers to discharge        Co-evaluation               AM-PAC PT "6 Clicks" Mobility  Outcome Measure Help needed turning from your back to your side while in a flat bed without using bedrails?: None Help needed moving from lying on your back to sitting on the side of a flat bed without using bedrails?: None Help needed moving to and from a bed to a chair (including a  wheelchair)?: A Little Help needed standing up from a chair using your arms (e.g., wheelchair or bedside chair)?: A Little Help needed to walk in hospital room?: A Little Help needed climbing 3-5 steps with a railing? : A Little 6 Click Score: 20    End of Session Equipment Utilized During Treatment: Oxygen Activity Tolerance: Patient tolerated treatment well;Treatment limited secondary to medical complications (Comment) (hypoxia) Patient left: in bed;with call bell/phone within reach Nurse Communication: Mobility status PT Visit Diagnosis: Other abnormalities of gait and mobility (R26.89)    Time: 6387-5643 PT Time Calculation (min) (ACUTE ONLY): 24 min   Charges:   PT Evaluation $PT Eval Moderate Complexity: 1 Mod PT Treatments $Therapeutic Exercise: 8-22 mins       Ina Homes, PT, DPT Acute Rehabilitation Services  Pager [phone] Office [phone]  Malachy Chamber 09/02/2019, 1:39 PM

## 2019-09-02 NOTE — Progress Notes (Signed)
Occupational Therapy Evaluation Patient Details Name: Jacob Page MRN: [id number] DOB: 11/05/1955 Today's Date: 09/02/2019    History of Present Illness Pt is a 64 y.o. male diagnoses with COVID-19 (pt unvaccinated) on 08/24/19 now admitted 08/28/19 with worsening SOB and syncopal episode. Workup for acute hypoxic respiratory failure and COVID PNA. Negative DVT. CXR 8/12 with worsening bilateral pulmonary infiltrates. PMH includes OSA. Of note, pt's wife previously hospitalized with COVID.   Clinical Impression   Pt independent and lives with his wife who was discharged form the hospital 8/13 (also covid +). 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 Pt able to mobilize to EOB in preparation for mobility and ADL when pt stated he didn't feel well, complaining of dizziness/nausea. Increased coughing with increased RR into 30s and SpO2 desat into low 80s - on 15L. BP spine 119/75; sitting EOB 107/62. Once returned to supine, pt complaining of not being able to breath with rapid shallow breaths. Encouraged relaxation and given additional 15 L with NRB briefly to help recover. SpO2 93 15L HFNC end of session. Educated pt on importance of being OOB in chair and proning when able  - pt verbalized understanding. Will follow acutely. Pt hopes to DC home with wife. Will further assess need for follow up OT pending progress.     Follow Up Recommendations  Supervision - Intermittent;Home health OT (pending progress)    Equipment Recommendations  3 in 1 bedside commode    Recommendations for Other Services       Precautions / Restrictions Precautions Precautions: Fall;Other (comment) Precaution Comments: can't lift over 50# due to hernia surgery in April Restrictions Weight Bearing Restrictions: No Other Position/Activity Restrictions: see above      Mobility Bed Mobility Overal bed mobility: Modified Independent               Transfers       General transfer  comment: unable to complete due to feelings of nausea/ apparent respiratory discomfort    Balance Overall balance assessment: Needs assistance Sitting-balance support: No upper extremity supported;Feet supported Sitting balance-Leahy Scale: Good                               ADL either performed or assessed with clinical judgement   ADL Overall ADL's : Needs assistance/impaired     Grooming: Set up   Upper Body Bathing: Set up;Sitting   Lower Body Bathing: Minimal assistance;Sit to/from stand   Upper Body Dressing : Set up;Sitting   Lower Body Dressing: Moderate assistance;Sit to/from stand                 General ADL Comments: ADL significantly limited at this time by DOE     Vision Baseline Vision/History: Wears glasses Wears Glasses: At all times       Perception     Praxis      Pertinent Vitals/Pain Pain Assessment: No/denies pain Faces Pain Scale: Hurts little more Pain Location: Headache Pain Descriptors / Indicators: Headache Pain Intervention(s): Patient requesting pain meds-RN notified     Hand Dominance Left   Extremity/Trunk Assessment Upper Extremity Assessment Upper Extremity Assessment: Overall WFL for tasks assessed   Lower Extremity Assessment Lower Extremity Assessment: Defer to PT evaluation   Cervical / Trunk Assessment Cervical / Trunk Assessment: Normal   Communication Communication Communication: No difficulties   Cognition Arousal/Alertness: Awake/alert Behavior During Therapy: WFL for tasks assessed/performed Overall Cognitive  Status: Within Functional Limits for tasks assessed                                 General Comments: WFL for simple tasks, not formally assessed   General Comments    Exercises Exercises: Other exercises Other Exercises Other Exercises: pursed lip breathing Other Exercises: theraband left in room - unable to complete due to not feeling well   Shoulder Instructions       Home Living Family/patient expects to be discharged to:: Private residence Living Arrangements: Spouse/significant other Available Help at Discharge: Family;Available PRN/intermittently Type of Home: House Home Access: Stairs to enter [employer] of Steps: 4 Entrance Stairs-Rails: Right Home Layout: One level;Laundry or work area in basement     [employer]: Producer, television/film/video: Pharmacist, community: Yes How Accessible: Accessible via walker Home Equipment: Bedside commode   Additional Comments: Daughter helping wife who is at home sick with COVID      Prior Functioning/Environment Level of Independence: Independent        Comments: 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        OT Problem List: Decreased activity tolerance;Decreased knowledge of use of DME or AE;Cardiopulmonary status limiting activity;Obesity;Pain      OT Treatment/Interventions: Self-care/ADL training;Therapeutic exercise;Neuromuscular education;Energy conservation;DME and/or AE instruction;Therapeutic activities;Patient/family education    OT Goals(Current goals can be found in the care plan section) Acute Rehab OT Goals Patient Stated Goal: get better OT Goal Formulation: With patient Time For Goal Achievement: 09/16/19 Potential to Achieve Goals: Good  OT Frequency: Min 2X/week   Barriers to D/C:    wife discharge 8/13 from hospital/Covid +       Co-evaluation              AM-PAC OT "6 Clicks" Daily Activity     Outcome Measure Help from another person eating meals?: None Help from another person taking care of personal grooming?: A Little Help from another person toileting, which includes using toliet, bedpan, or urinal?: A Little Help from another person bathing (including washing, rinsing, drying)?: A Lot Help from another person to put on and taking off regular upper body clothing?: A Little Help from  another person to put on and taking off regular lower body clothing?: A Lot 6 Click Score: 17   End of Session Equipment Utilized During Treatment: Oxygen (15L; briefly 15L NRB to help recover) Nurse Communication: Mobility status;Other (comment) (requests cough meds; pt response)  Activity Tolerance: Patient limited by fatigue;Other (comment) (limited by DOE) Patient left: in bed;with call bell/phone within reach  OT Visit Diagnosis: Unsteadiness on feet (R26.81);Muscle weakness (generalized) (M62.81);Pain Pain - part of body:  (chest)                Time: 3016-0109 OT Time Calculation (min): 30 min Charges:  OT General Charges $OT Visit: 1 Visit OT Evaluation $OT Eval Moderate Complexity: 1 Mod OT Treatments $Self Care/Home Management : 8-22 mins  Luisa Dago, OT/L   Acute OT Clinical Specialist Acute Rehabilitation Services Pager [phone] Office [phone]   [facility] 09/02/2019, 3:37 PM

## 2019-09-03 ENCOUNTER — Inpatient Hospital Stay (HOSPITAL_COMMUNITY): Payer: 59

## 2019-09-03 DIAGNOSIS — U071 COVID-19: Secondary | ICD-10-CM | POA: Diagnosis not present

## 2019-09-03 LAB — CBC
HCT: 50.2 % (ref 39.0–52.0)
Hemoglobin: 16.7 g/dL (ref 13.0–17.0)
MCH: 29.2 pg (ref 26.0–34.0)
MCHC: 33.3 g/dL (ref 30.0–36.0)
MCV: 87.9 fL (ref 80.0–100.0)
Platelets: 291 10*3/uL (ref 150–400)
RBC: 5.71 MIL/uL (ref 4.22–5.81)
RDW: 13 % (ref 11.5–15.5)
WBC: 13.8 10*3/uL — ABNORMAL HIGH (ref 4.0–10.5)
nRBC: 0 % (ref 0.0–0.2)

## 2019-09-03 LAB — PROCALCITONIN: Procalcitonin: <0.1 ng/mL

## 2019-09-03 LAB — COMPREHENSIVE METABOLIC PANEL
ALT: 96 U/L — ABNORMAL HIGH (ref 0–44)
AST: 28 U/L (ref 15–41)
Albumin: 2.7 g/dL — ABNORMAL LOW (ref 3.5–5.0)
Alkaline Phosphatase: 52 U/L (ref 38–126)
Anion gap: 10 (ref 5–15)
BUN: 28 mg/dL — ABNORMAL HIGH (ref 8–23)
CO2: 27 mmol/L (ref 22–32)
Calcium: 8.5 mg/dL — ABNORMAL LOW (ref 8.9–10.3)
Chloride: 101 mmol/L (ref 98–111)
Creatinine, Ser: 0.82 mg/dL (ref 0.61–1.24)
GFR calc Af Amer: >60 mL/min (ref >60)
GFR calc non Af Amer: >60 mL/min (ref >60)
Glucose, Bld: 125 mg/dL — ABNORMAL HIGH (ref 70–99)
Potassium: 4.2 mmol/L (ref 3.5–5.1)
Sodium: 138 mmol/L (ref 135–145)
Total Bilirubin: 1.1 mg/dL (ref 0.3–1.2)
Total Protein: 6.1 g/dL — ABNORMAL LOW (ref 6.5–8.1)

## 2019-09-03 LAB — GLUCOSE, CAPILLARY
Glucose-Capillary: 95 mg/dL (ref 70–99)
Glucose-Capillary: 154 mg/dL — ABNORMAL HIGH (ref 70–99)
Glucose-Capillary: 145 mg/dL — ABNORMAL HIGH (ref 70–99)
Glucose-Capillary: 191 mg/dL — ABNORMAL HIGH (ref 70–99)

## 2019-09-03 LAB — MAGNESIUM: Magnesium: 2.4 mg/dL (ref 1.7–2.4)

## 2019-09-03 LAB — BRAIN NATRIURETIC PEPTIDE: B Natriuretic Peptide: 252.7 pg/mL — ABNORMAL HIGH (ref 0.0–100.0)

## 2019-09-03 LAB — D-DIMER, QUANTITATIVE: D-Dimer, Quant: 1.15 ug/mL-FEU — ABNORMAL HIGH (ref 0.00–0.50)

## 2019-09-03 LAB — C-REACTIVE PROTEIN: CRP: 0.8 mg/dL (ref <1.0)

## 2019-09-03 MED ORDER — FUROSEMIDE 10 MG/ML IJ SOLN
40.0000 mg | Freq: Once | INTRAMUSCULAR | Status: AC
  Administered 2019-09-03: 40 mg via INTRAVENOUS
  Filled 2019-09-03: qty 4

## 2019-09-03 MED ORDER — WITCH HAZEL-GLYCERIN EX PADS
MEDICATED_PAD | CUTANEOUS | Status: DC | PRN
  Filled 2019-09-03 (×2): qty 100

## 2019-09-03 NOTE — Progress Notes (Signed)
Jacob Page  UXL:[id number] DOB: 10/04/1955 DOA: 08/28/2019 PCP: Paulina Fusi, MD    Brief Narrative:  64 year old with no significant medical history who was diagnosed with Covid 8/5 who presented with a 4 to 5-day history of progressively worsening shortness of breath and an episode of syncope. His wife had previously been hospitalized with Covid. In the ED he was found to be suffering with acute hypoxic respiratory failure and Covid pneumonia and was admitted.  Significant Events: 8/5 COVID-19 positive  8/9 Admit to [facility] for syncope/hypoxia (3-4 L of oxygen) due to COVID-19 pneumonia 8/10 worsening hypoxia-up to 6 L of oxygen - Actemra ordered 8/10 lower extremity Doppler negative for DVT 8/10 TTE -EF 65-70% with no WMA or significant valvular abnormalities  Date of Positive COVID Test: 8/9 (confirmable in Epic)  Vaccination Status: Unvaccinated  COVID-19 specific Treatment: Steroid 8/9 > Remdesivir 8/9 > Actemra 8/9  Antimicrobials:  None  Subjective:  Patient in bed, appears comfortable, denies any headache, no fever, no chest pain or pressure, improving shortness of breath , no abdominal pain. No focal weakness.   Assessment & Plan:  COVID Pneumonia -acute hypoxic respiratory failure - unfortunately he is unvaccinated and got severe disease, he has been treated with Actemra, IV steroids and remdesivir, inflammatory markers have stabilized although he is still quite tenuous and requiring large amounts of oxygen.  Continue to gently taper down steroids, advance activity, encouraged to sit up in chair in daytime and use I-S and flutter valve for pulmonary toiletry.  Recent Labs  Lab 08/28/19 1524 08/28/19 1830 08/29/19 0757 08/29/19 0757 08/30/19 0705 08/31/19 0744 09/01/19 0349 09/02/19 1759 09/03/19 0551  DDIMER  --   --  0.48   < > 0.69* 1.13* 1.06* 1.24* 1.15*  FERRITIN  --  572* 589*  --  744* 1,118* 701*  --   --   CRP  --   --  8.4*   < > 3.9* 1.1*  0.7 1.1* 0.8  ALT   < >  --  39   < > 33 168* 153* 97* 96*  PROCALCITON  --   --  <0.10  --   --   --   --   --  <0.10   < > = values in this interval not displayed.   SpO2: 90 % O2 Flow Rate (L/min): 15 L/min   Syncope Felt to be either orthostatic or vasovagal -TTE noted EF 65-70% with hyperdynamic LV and no regional wall motion abnormalities  Mild transaminitis Felt to be due to Covid and treatment of same -follow trend  Recent Labs  Lab 08/30/19 0705 08/31/19 0744 09/01/19 0349 09/02/19 1759 09/03/19 0551  AST 33 110* 51* 33 28  ALT 33 168* 153* 97* 96*  ALKPHOS 48 58 55 48 52  BILITOT 0.8 1.0 1.1 1.2 1.1  PROT 6.2* 7.0 6.4* 5.8* 6.1*  ALBUMIN 2.8* 3.1* 3.0* 2.8* 2.7*     Hypomagnesemia Corrected with supplementation  Steroid-induced hyperglycemia No prior history of DM and A1c not consistent with DM -trend CBGs and administer insulin as needed if CBGs consistently greater than 200  CBG (last 3)  Recent Labs    09/02/19 1536 09/02/19 2021 09/03/19 0745  GLUCAP 122* 230* 95    Obesity - Body mass index is 32.29 kg/m.  Follow with PCP for weight loss.    DVT prophylaxis: Lovenox  Code Status: FULL CODE  Family Communication: Aram Beecham [phone]  on 09/03/2019 voice message left at 10:08  AM.  Status is: Inpatient  Remains inpatient appropriate because:Inpatient level of care appropriate due to severity of illness   Dispo: The patient is from: Home              Anticipated d/c is to: Home              Anticipated d/c date is: > 3 days              Patient currently is not medically stable to d/c.   Consultants:  none  Objective: Blood pressure 110/63, pulse 63, temperature 97.8 F (36.6 C), temperature source Oral, resp. rate 18, height 6\' 1"  (1.854 m), weight 111 kg, SpO2 90 %.  Intake/Output Summary (Last 24 hours) at 09/03/2019 1003 Last data filed at 09/03/2019 0900 Gross per 24 hour  Intake 1080 ml  Output 1100 ml  Net -20 ml    Filed Weights   08/28/19 1516 09/03/19 0258  Weight: 111.1 kg 111 kg    Examination:  Awake Alert, No new F.N deficits, Normal affect [place].AT,PERRAL Supple Neck,No JVD, No cervical lymphadenopathy appriciated.  Symmetrical Chest wall movement, Good air movement bilaterally, few rales RRR,No Gallops, Rubs or new Murmurs, No Parasternal Heave +ve B.Sounds, Abd Soft, No tenderness, No organomegaly appriciated, No rebound - guarding or rigidity. No Cyanosis, Clubbing or edema, No new Rash or bruise    CBC: Recent Labs  Lab 08/28/19 1524 08/29/19 0019 08/29/19 0757 08/30/19 0705 09/01/19 0349 09/02/19 1759 09/03/19 0551  WBC 10.1  --  8.7   < > 15.9* 16.7* 13.8*  NEUTROABS 8.7*  --  7.8*  --   --   --   --   HGB 14.9   < > 14.1   < > 15.9 15.7 16.7  HCT 44.1   < > 43.1   < > 48.0 47.3 50.2  MCV 84.3  --  86.2   < > 86.5 85.2 87.9  PLT 238  --  230   < > 400 417* 291   < > = values in this interval not displayed.   Basic Metabolic Panel: Recent Labs  Lab 08/28/19 1524 08/28/19 2037 08/30/19 0705 08/30/19 0705 08/31/19 0744 08/31/19 0744 09/01/19 0349 09/02/19 1759 09/03/19 0551  NA 141   < > 139   < > 142   < > 141 138 138  K 3.3*   < > 3.8   < > 3.4*   < > 3.9 4.1 4.2  CL 105   < > 101   < > 98   < > 98 100 101  CO2 23   < > 26   < > 30   < > 30 29 27   GLUCOSE 118*   < > 157*   < > 173*   < > 196* 142* 125*  BUN 18   < > 19   < > 25*   < > 31* 29* 28*  CREATININE 0.77   < > 0.65   < > 0.94   < > 0.98 0.84 0.82  CALCIUM 8.4*   < > 8.3*   < > 8.7*   < > 8.7* 8.5* 8.5*  MG 1.8   < > 2.3  --  2.2  --   --   --  2.4  PHOS 2.2*  --   --   --   --   --   --   --   --    < > = values  in this interval not displayed.   GFR: Estimated Creatinine Clearance: 118.8 mL/min (by C-G formula based on SCr of 0.82 mg/dL).  Liver Function Tests: Recent Labs  Lab 08/31/19 0744 09/01/19 0349 09/02/19 1759 09/03/19 0551  AST 110* 51* 33 28  ALT 168* 153* 97* 96*  ALKPHOS  58 55 48 52  BILITOT 1.0 1.1 1.2 1.1  PROT 7.0 6.4* 5.8* 6.1*  ALBUMIN 3.1* 3.0* 2.8* 2.7*    Coagulation Profile: Recent Labs  Lab 08/28/19 1524  INR 1.0    HbA1C: Hgb A1c MFr Bld  Date/Time Value Ref Range Status  08/29/2019 07:14 AM 5.8 (H) 4.8 - 5.6 % Final    Comment:    (NOTE) Pre diabetes:          5.7%-6.4%  Diabetes:              >6.4%  Glycemic control for   <7.0% adults with diabetes     CBG: Recent Labs  Lab 09/02/19 0747 09/02/19 1216 09/02/19 1536 09/02/19 2021 09/03/19 0745  GLUCAP 162* 139* 122* 230* 95    Recent Results (from the past 240 hour(s))  SARS Coronavirus 2 by RT PCR (hospital order, performed in [facility] hospital lab) Nasopharyngeal Nasopharyngeal Swab     Status: Abnormal   Collection Time: 08/28/19  3:24 PM   Specimen: Nasopharyngeal Swab  Result Value Ref Range Status   SARS Coronavirus 2 POSITIVE (A) NEGATIVE Final    Comment: RESULT CALLED TO, READ BACK BY AND VERIFIED WITH: D,SIDBURY @1715  08/28/19 EB (NOTE) SARS-CoV-2 target nucleic acids are DETECTED  SARS-CoV-2 RNA is generally detectable in upper respiratory specimens  during the acute phase of infection.  Positive results are indicative  of the presence of the identified virus, but do not rule out bacterial infection or co-infection with other pathogens not detected by the test.  Clinical correlation with patient history and  other diagnostic information is necessary to determine patient infection status.  The expected result is negative.  Fact Sheet for Patients:   10/28/19   Fact Sheet for Healthcare Providers:   BoilerBrush.com.cy    This test is not yet approved or cleared by the https://pope.com/ FDA and  has been authorized for detection and/or diagnosis of SARS-CoV-2 by FDA under an Emergency Use Authorization (EUA).  This EUA will remain in effect (meaning this test can be  used) for the duration of   the COVID-19 declaration under Section 564(b)(1) of the Act, 21 U.S.C. section 360-bbb-3(b)(1), unless the authorization is terminated or revoked sooner.  Performed at [facility], [address] [place], [place]   Culture, blood (Routine x 2)     Status: None   Collection Time: 08/28/19  3:24 PM   Specimen: BLOOD  Result Value Ref Range Status   Specimen Description BLOOD SITE NOT SPECIFIED  Final   Special Requests   Final    BOTTLES DRAWN AEROBIC AND ANAEROBIC Blood Culture adequate volume   Culture   Final    NO GROWTH 5 DAYS Performed at [facility], [address] [place], [place]    Report Status 09/02/2019 FINAL  Final  Culture, blood (Routine x 2)     Status: None   Collection Time: 08/28/19  5:34 PM   Specimen: BLOOD  Result Value Ref Range Status   Specimen Description BLOOD SITE NOT SPECIFIED  Final   Special Requests   Final    BOTTLES DRAWN AEROBIC ONLY  Blood Culture results may not be optimal due to an excessive volume of blood received in culture bottles   Culture   Final    NO GROWTH 5 DAYS Performed at [facility], [address] [place], [place] [address]    Report Status 09/02/2019 FINAL  Final  Urine culture     Status: None   Collection Time: 08/28/19  7:54 PM   Specimen: Urine, Random  Result Value Ref Range Status   Specimen Description URINE, RANDOM  Final   Special Requests NONE  Final   Culture   Final    NO GROWTH Performed at [facility], [address] [place], [place] [address]    Report Status 08/29/2019 FINAL  Final     Scheduled Meds: . benzonatate  200 mg Oral TID  . enoxaparin (LOVENOX) injection  55 mg Subcutaneous Q24H  . furosemide  40 mg Intravenous Once  . insulin aspart  0-5 Units Subcutaneous QHS  . insulin aspart  0-9 Units Subcutaneous TID WC  . methylPREDNISolone (SOLU-MEDROL) injection  60 mg Intravenous Daily      LOS: 6 days   Signature  Susa RaringPrashant Jaaron Oleson M.D  on 09/03/2019 at 10:03 AM   -  To page go to [email or web address]

## 2019-09-04 DIAGNOSIS — U071 COVID-19: Secondary | ICD-10-CM | POA: Diagnosis not present

## 2019-09-04 LAB — COMPREHENSIVE METABOLIC PANEL
ALT: 74 U/L — ABNORMAL HIGH (ref 0–44)
AST: 21 U/L (ref 15–41)
Albumin: 2.9 g/dL — ABNORMAL LOW (ref 3.5–5.0)
Alkaline Phosphatase: 55 U/L (ref 38–126)
Anion gap: 12 (ref 5–15)
BUN: 31 mg/dL — ABNORMAL HIGH (ref 8–23)
CO2: 28 mmol/L (ref 22–32)
Calcium: 8.5 mg/dL — ABNORMAL LOW (ref 8.9–10.3)
Chloride: 98 mmol/L (ref 98–111)
Creatinine, Ser: 0.86 mg/dL (ref 0.61–1.24)
GFR calc Af Amer: >60 mL/min (ref >60)
GFR calc non Af Amer: >60 mL/min (ref >60)
Glucose, Bld: 137 mg/dL — ABNORMAL HIGH (ref 70–99)
Potassium: 4 mmol/L (ref 3.5–5.1)
Sodium: 138 mmol/L (ref 135–145)
Total Bilirubin: 1.2 mg/dL (ref 0.3–1.2)
Total Protein: 6.2 g/dL — ABNORMAL LOW (ref 6.5–8.1)

## 2019-09-04 LAB — CBC
HCT: 50.9 % (ref 39.0–52.0)
Hemoglobin: 16.8 g/dL (ref 13.0–17.0)
MCH: 28.3 pg (ref 26.0–34.0)
MCHC: 33 g/dL (ref 30.0–36.0)
MCV: 85.8 fL (ref 80.0–100.0)
Platelets: 336 10*3/uL (ref 150–400)
RBC: 5.93 MIL/uL — ABNORMAL HIGH (ref 4.22–5.81)
RDW: 12.8 % (ref 11.5–15.5)
WBC: 15.9 10*3/uL — ABNORMAL HIGH (ref 4.0–10.5)
nRBC: 0 % (ref 0.0–0.2)

## 2019-09-04 LAB — C-REACTIVE PROTEIN: CRP: 1.1 mg/dL — ABNORMAL HIGH (ref <1.0)

## 2019-09-04 LAB — D-DIMER, QUANTITATIVE: D-Dimer, Quant: 1.34 ug/mL-FEU — ABNORMAL HIGH (ref 0.00–0.50)

## 2019-09-04 LAB — PROCALCITONIN: Procalcitonin: <0.1 ng/mL

## 2019-09-04 LAB — BRAIN NATRIURETIC PEPTIDE: B Natriuretic Peptide: 31.8 pg/mL (ref 0.0–100.0)

## 2019-09-04 LAB — GLUCOSE, CAPILLARY
Glucose-Capillary: 144 mg/dL — ABNORMAL HIGH (ref 70–99)
Glucose-Capillary: 161 mg/dL — ABNORMAL HIGH (ref 70–99)
Glucose-Capillary: 200 mg/dL — ABNORMAL HIGH (ref 70–99)
Glucose-Capillary: 164 mg/dL — ABNORMAL HIGH (ref 70–99)

## 2019-09-04 LAB — MAGNESIUM: Magnesium: 2.3 mg/dL (ref 1.7–2.4)

## 2019-09-04 MED ORDER — SALINE SPRAY 0.65 % NA SOLN
1.0000 | NASAL | Status: DC | PRN
  Administered 2019-09-04: 1 via NASAL
  Filled 2019-09-04 (×2): qty 44

## 2019-09-04 NOTE — Progress Notes (Signed)
Patient able to walk in the hallway with no distress; sat O2 93% on 8 L [place] while walking. At rest, sat O2 95 on 8 L HFNC.

## 2019-09-04 NOTE — Progress Notes (Signed)
Occupational Therapy Treatment Patient Details Name: Jacob Page MRN: [id number] DOB: 20-Jan-1956 Today's Date: 09/04/2019    History of present illness Pt is a 64 y.o. male diagnoses with COVID-19 (pt unvaccinated) on 08/24/19 now admitted 08/28/19 with worsening SOB and syncopal episode. Workup for acute hypoxic respiratory failure and COVID PNA. Negative DVT. CXR 8/12 with worsening bilateral pulmonary infiltrates. PMH includes OSA. Of note, pt's wife previously hospitalized with COVID.   OT comments  Pt progressing towards established OT goals. Pt performing grooming at sink and functional mobility in room with Supervision on 8L via HFNC. SpO2 >91% on 8L. Pt eager to learn exercises for strengthening and activity tolerance. Providing education on BUE exercises with theraband (yellow). Pt performing 10 reps each; also, performing BLE AROM, IS, and flutter valve. Continue to recommend dc to home with HHOT and will continue to follow acutely as admitted.    Follow Up Recommendations  Home health OT;Supervision - Intermittent (Pending progress)    Equipment Recommendations  3 in 1 bedside commode    Recommendations for Other Services      Precautions / Restrictions Precautions Precautions: Fall;Other (comment) Precaution Comments: can't lift over 50# due to hernia surgery in April       Mobility Bed Mobility               General bed mobility comments: In recliner upon arrival  Transfers Overall transfer level: Needs assistance Equipment used: None Transfers: Sit to/from Stand Sit to Stand: Supervision         General transfer comment: Supervision for safety    Balance Overall balance assessment: Needs assistance Sitting-balance support: No upper extremity supported;Feet supported Sitting balance-Leahy Scale: Good     Standing balance support: No upper extremity supported;During functional activity Standing balance-Leahy Scale: Fair                              ADL either performed or assessed with clinical judgement   ADL Overall ADL's : Needs assistance/impaired     Grooming: Supervision/safety;Set up;Standing;Oral care Grooming Details (indicate cue type and Jacob): Supervision for safety                             Functional mobility during ADLs: Supervision/safety General ADL Comments: Pt performing grooming at sink with Supervision     Vision       Perception     Praxis      Cognition Arousal/Alertness: Awake/alert Behavior During Therapy: WFL for tasks assessed/performed Overall Cognitive Status: Within Functional Limits for tasks assessed                                 General Comments: Very motivated        Exercises Exercises: General Upper Extremity;Other exercises;General Lower Extremity General Exercises - Upper Extremity Shoulder Horizontal ABduction: Strengthening;10 reps;Seated;Theraband Theraband Level (Shoulder Horizontal Abduction): Level 1 (Yellow) Shoulder Horizontal ADduction: Strengthening;10 reps;Seated;Theraband Theraband Level (Shoulder Horizontal Adduction): Level 1 (Yellow) Elbow Flexion: Strengthening;10 reps;Seated;Theraband Theraband Level (Elbow Flexion): Level 1 (Yellow) Elbow Extension: Strengthening;10 reps;Seated;Theraband Theraband Level (Elbow Extension): Level 1 (Yellow) General Exercises - Lower Extremity Long Arc Quad: AROM;10 reps;Seated Hip Flexion/Marching: AROM;10 reps;Seated Other Exercises Other Exercises: pursed lip breathing with arm movements Other Exercises: Theraband yellow; diagnols; 10x; BUEs Other Exercises: Flutter valve 5x Other Exercises: IS 5x pulled  1250 ml   Shoulder Instructions       General Comments SpO2 >91% on 8L via HFNC. HR 90-80s. RR 20s; elevating to 30 at end of exercises,. BP 118/73    Pertinent Vitals/ Pain       Pain Assessment: No/denies pain  Home Living                                           Prior Functioning/Environment              Frequency  Min 2X/week        Progress Toward Goals  OT Goals(current goals can now be found in the care plan section)  Progress towards OT goals: Progressing toward goals  Acute Rehab OT Goals Patient Stated Goal: get better OT Goal Formulation: With patient Time For Goal Achievement: 09/16/19 Potential to Achieve Goals: Good ADL Goals Pt Will Perform Lower Body Bathing: with modified independence;sit to/from stand Pt Will Perform Lower Body Dressing: with modified independence;sit to/from stand Pt Will Transfer to Toilet: with modified independence;ambulating Additional ADL Goal #1: Independently verbalize 3 energy conservation strategies Additional ADL Goal #2: Demonstrate 2 relaxation strategies with min vc to help manage anxiety during ADL tasks and mobility  Plan Discharge plan remains appropriate    Co-evaluation                 AM-PAC OT "6 Clicks" Daily Activity     Outcome Measure   Help from another person eating meals?: None Help from another person taking care of personal grooming?: A Little Help from another person toileting, which includes using toliet, bedpan, or urinal?: A Little Help from another person bathing (including washing, rinsing, drying)?: A Lot Help from another person to put on and taking off regular upper body clothing?: A Little Help from another person to put on and taking off regular lower body clothing?: A Lot 6 Click Score: 17    End of Session Equipment Utilized During Treatment: Oxygen (8 via HFNC)  OT Visit Diagnosis: Unsteadiness on feet (R26.81);Muscle weakness (generalized) (M62.81);Pain   Activity Tolerance Patient tolerated treatment well   Patient Left in chair;with call bell/phone within reach   Nurse Communication Mobility status        Time: 1027-2536 OT Time Calculation (min): 24 min  Charges: OT General Charges $OT Visit: 1 Visit OT  Treatments $Self Care/Home Management : 8-22 mins $Therapeutic Exercise: 8-22 mins  Verble Styron MSOT, OTR/L Acute Rehab Pager: [phone] Office: [phone]   Theodoro Grist Keoni Havey 09/04/2019, 5:05 PM

## 2019-09-04 NOTE — Progress Notes (Signed)
Jacob Page  YSA:[id number] DOB: 1955/02/13 DOA: 08/28/2019 PCP: Paulina Fusi, MD    Brief Narrative:  64 year old with no significant medical history who was diagnosed with Covid 8/5 who presented with a 4 to 5-day history of progressively worsening shortness of breath and an episode of syncope. His wife had previously been hospitalized with Covid. In the ED he was found to be suffering with acute hypoxic respiratory failure and Covid pneumonia and was admitted.  Significant Events: 8/5 COVID-19 positive  8/9 Admit to [facility] for syncope/hypoxia (3-4 L of oxygen) due to COVID-19 pneumonia 8/10 worsening hypoxia-up to 6 L of oxygen - Actemra ordered 8/10 lower extremity Doppler negative for DVT 8/10 TTE -EF 65-70% with no WMA or significant valvular abnormalities  Date of Positive COVID Test: 8/9 (confirmable in Epic)  Vaccination Status: Unvaccinated  COVID-19 specific Treatment: Steroid 8/9 > Remdesivir 8/9 > Actemra 8/9  Antimicrobials:  None  Subjective:  Patient in chair, appears comfortable, denies any headache, no fever, no chest pain or pressure, no shortness of breath , no abdominal pain. No focal weakness.    Assessment & Plan:  COVID Pneumonia -acute hypoxic respiratory failure - unfortunately he is unvaccinated and got severe disease, he has been treated with Actemra, IV steroids and remdesivir, inflammatory markers have stabilized although he is still quite tenuous and requiring large amounts of oxygen.  Continue to gently taper down steroids, advance activity, encouraged to sit up in chair in daytime and use I-S and flutter valve for pulmonary toiletry.  Recent Labs  Lab 08/28/19 1524 08/28/19 1830 08/29/19 0757 08/29/19 0757 08/30/19 0705 08/30/19 0705 08/31/19 0744 09/01/19 0349 09/02/19 1759 09/03/19 0551 09/04/19 0333  DDIMER  --   --  0.48   < > 0.69*   < > 1.13* 1.06* 1.24* 1.15* 1.34*  FERRITIN  --  572* 589*  --  744*  --  1,118* 701*  --    --   --   CRP  --   --  8.4*   < > 3.9*   < > 1.1* 0.7 1.1* 0.8 1.1*  ALT   < >  --  39   < > 33   < > 168* 153* 97* 96* 74*  PROCALCITON  --   --  <0.10  --   --   --   --   --   --  <0.10 <0.10   < > = values in this interval not displayed.   SpO2: 94 % O2 Flow Rate (L/min): 12 L/min   Syncope Felt to be either orthostatic or vasovagal -TTE noted EF 65-70% with hyperdynamic LV and no regional wall motion abnormalities  Mild transaminitis Felt to be due to Covid and treatment of same -follow trend  Recent Labs  Lab 08/31/19 0744 09/01/19 0349 09/02/19 1759 09/03/19 0551 09/04/19 0333  AST 110* 51* 33 28 21  ALT 168* 153* 97* 96* 74*  ALKPHOS 58 55 48 52 55  BILITOT 1.0 1.1 1.2 1.1 1.2  PROT 7.0 6.4* 5.8* 6.1* 6.2*  ALBUMIN 3.1* 3.0* 2.8* 2.7* 2.9*     Hypomagnesemia Corrected with supplementation  Steroid-induced hyperglycemia No prior history of DM and A1c not consistent with DM -trend CBGs and administer insulin as needed if CBGs consistently greater than 200  CBG (last 3)  Recent Labs    09/03/19 1621 09/03/19 2147 09/04/19 0736  GLUCAP 145* 191* 144*    Obesity - Body mass index is 32.29 kg/m.  Follow with PCP for weight loss.    DVT prophylaxis: Lovenox  Code Status: FULL CODE  Family Communication: Aram BeechamCynthia [phone]  on 09/03/2019 voice message left at 10:08 AM.  Status is: Inpatient  Remains inpatient appropriate because:Inpatient level of care appropriate due to severity of illness   Dispo: The patient is from: Home              Anticipated d/c is to: Home              Anticipated d/c date is: > 3 days              Patient currently is not medically stable to d/c.   Consultants:  none  Objective: Blood pressure 108/75, pulse 68, temperature 98 F (36.7 C), temperature source Oral, resp. rate 20, height 6\' 1"  (1.854 m), weight 111 kg, SpO2 94 %.  Intake/Output Summary (Last 24 hours) at 09/04/2019 1127 Last data filed at 09/04/2019  0939 Gross per 24 hour  Intake 780 ml  Output 2400 ml  Net -1620 ml   Filed Weights   08/28/19 1516 09/03/19 0258  Weight: 111.1 kg 111 kg    Examination:  Awake Alert, No new F.N deficits, Normal affect [place].AT,PERRAL Supple Neck,No JVD, No cervical lymphadenopathy appriciated.  Symmetrical Chest wall movement, Good air movement bilaterally, CTAB RRR,No Gallops, Rubs or new Murmurs, No Parasternal Heave +ve B.Sounds, Abd Soft, No tenderness, No organomegaly appriciated, No rebound - guarding or rigidity. No Cyanosis, Clubbing or edema, No new Rash or bruise   CBC: Recent Labs  Lab 08/28/19 1524 08/29/19 0019 08/29/19 0757 08/30/19 0705 09/02/19 1759 09/03/19 0551 09/04/19 0333  WBC 10.1  --  8.7   < > 16.7* 13.8* 15.9*  NEUTROABS 8.7*  --  7.8*  --   --   --   --   HGB 14.9   < > 14.1   < > 15.7 16.7 16.8  HCT 44.1   < > 43.1   < > 47.3 50.2 50.9  MCV 84.3  --  86.2   < > 85.2 87.9 85.8  PLT 238  --  230   < > 417* 291 336   < > = values in this interval not displayed.   Basic Metabolic Panel: Recent Labs  Lab 08/28/19 1524 08/28/19 2037 08/31/19 0744 09/01/19 0349 09/02/19 1759 09/03/19 0551 09/04/19 0333  NA 141   < > 142   < > 138 138 138  K 3.3*   < > 3.4*   < > 4.1 4.2 4.0  CL 105   < > 98   < > 100 101 98  CO2 23   < > 30   < > 29 27 28   GLUCOSE 118*   < > 173*   < > 142* 125* 137*  BUN 18   < > 25*   < > 29* 28* 31*  CREATININE 0.77   < > 0.94   < > 0.84 0.82 0.86  CALCIUM 8.4*   < > 8.7*   < > 8.5* 8.5* 8.5*  MG 1.8   < > 2.2  --   --  2.4 2.3  PHOS 2.2*  --   --   --   --   --   --    < > = values in this interval not displayed.   GFR: Estimated Creatinine Clearance: 113.3 mL/min (by C-G formula based on SCr of 0.86 mg/dL).  Liver Function Tests: Recent Labs  Lab 09/01/19 0349 09/02/19 1759 09/03/19 0551 09/04/19 0333  AST 51* 33 28 21  ALT 153* 97* 96* 74*  ALKPHOS 55 48 52 55  BILITOT 1.1 1.2 1.1 1.2  PROT 6.4* 5.8* 6.1* 6.2*    ALBUMIN 3.0* 2.8* 2.7* 2.9*    Coagulation Profile: Recent Labs  Lab 08/28/19 1524  INR 1.0    HbA1C: Hgb A1c MFr Bld  Date/Time Value Ref Range Status  08/29/2019 07:14 AM 5.8 (H) 4.8 - 5.6 % Final    Comment:    (NOTE) Pre diabetes:          5.7%-6.4%  Diabetes:              >6.4%  Glycemic control for   <7.0% adults with diabetes     CBG: Recent Labs  Lab 09/03/19 0745 09/03/19 1150 09/03/19 1621 09/03/19 2147 09/04/19 0736  GLUCAP 95 154* 145* 191* 144*    Recent Results (from the past 240 hour(s))  SARS Coronavirus 2 by RT PCR (hospital order, performed in [facility] hospital lab) Nasopharyngeal Nasopharyngeal Swab     Status: Abnormal   Collection Time: 08/28/19  3:24 PM   Specimen: Nasopharyngeal Swab  Result Value Ref Range Status   SARS Coronavirus 2 POSITIVE (A) NEGATIVE Final    Comment: RESULT CALLED TO, READ BACK BY AND VERIFIED WITH: D,SIDBURY @1715  08/28/19 EB (NOTE) SARS-CoV-2 target nucleic acids are DETECTED  SARS-CoV-2 RNA is generally detectable in upper respiratory specimens  during the acute phase of infection.  Positive results are indicative  of the presence of the identified virus, but do not rule out bacterial infection or co-infection with other pathogens not detected by the test.  Clinical correlation with patient history and  other diagnostic information is necessary to determine patient infection status.  The expected result is negative.  Fact Sheet for Patients:   10/28/19   Fact Sheet for Healthcare Providers:   BoilerBrush.com.cy    This test is not yet approved or cleared by the https://pope.com/ FDA and  has been authorized for detection and/or diagnosis of SARS-CoV-2 by FDA under an Emergency Use Authorization (EUA).  This EUA will remain in effect (meaning this test can be  used) for the duration of  the COVID-19 declaration under Section 564(b)(1) of the Act,  21 U.S.C. section 360-bbb-3(b)(1), unless the authorization is terminated or revoked sooner.  Performed at [facility], [address] [place], [place]   Culture, blood (Routine x 2)     Status: None   Collection Time: 08/28/19  3:24 PM   Specimen: BLOOD  Result Value Ref Range Status   Specimen Description BLOOD SITE NOT SPECIFIED  Final   Special Requests   Final    BOTTLES DRAWN AEROBIC AND ANAEROBIC Blood Culture adequate volume   Culture   Final    NO GROWTH 5 DAYS Performed at [facility], [address] [place], [place]    Report Status 09/02/2019 FINAL  Final  Culture, blood (Routine x 2)     Status: None   Collection Time: 08/28/19  5:34 PM   Specimen: BLOOD  Result Value Ref Range Status   Specimen Description BLOOD SITE NOT SPECIFIED  Final   Special Requests   Final    BOTTLES DRAWN AEROBIC ONLY Blood Culture results may not be optimal due to an excessive volume of blood received in culture bottles   Culture   Final    NO  GROWTH 5 DAYS Performed at [facility], [address] [place], [place] [address]    Report Status 09/02/2019 FINAL  Final  Urine culture     Status: None   Collection Time: 08/28/19  7:54 PM   Specimen: Urine, Random  Result Value Ref Range Status   Specimen Description URINE, RANDOM  Final   Special Requests NONE  Final   Culture   Final    NO GROWTH Performed at [facility], [address], [place], [place] [address]    Report Status 08/29/2019 FINAL  Final     Scheduled Meds: . benzonatate  200 mg Oral TID  . enoxaparin (LOVENOX) injection  55 mg Subcutaneous Q24H  . insulin aspart  0-5 Units Subcutaneous QHS  . insulin aspart  0-9 Units Subcutaneous TID WC  . methylPREDNISolone (SOLU-MEDROL) injection  60 mg Intravenous Daily      LOS: 7 days   Signature  Susa Raring M.D on 09/04/2019 at 11:27 AM   -  To page go to [email or web address]

## 2019-09-05 DIAGNOSIS — U071 COVID-19: Secondary | ICD-10-CM | POA: Diagnosis not present

## 2019-09-05 LAB — GLUCOSE, CAPILLARY
Glucose-Capillary: 104 mg/dL — ABNORMAL HIGH (ref 70–99)
Glucose-Capillary: 136 mg/dL — ABNORMAL HIGH (ref 70–99)
Glucose-Capillary: 145 mg/dL — ABNORMAL HIGH (ref 70–99)
Glucose-Capillary: 136 mg/dL — ABNORMAL HIGH (ref 70–99)

## 2019-09-05 LAB — BRAIN NATRIURETIC PEPTIDE: B Natriuretic Peptide: 37 pg/mL (ref 0.0–100.0)

## 2019-09-05 LAB — MAGNESIUM: Magnesium: 2.3 mg/dL (ref 1.7–2.4)

## 2019-09-05 MED ORDER — DOCUSATE SODIUM 100 MG PO CAPS
200.0000 mg | ORAL_CAPSULE | Freq: Two times a day (BID) | ORAL | Status: DC
  Administered 2019-09-05 – 2019-09-08 (×6): 200 mg via ORAL
  Filled 2019-09-05 (×7): qty 2

## 2019-09-05 MED ORDER — POLYETHYLENE GLYCOL 3350 17 G PO PACK
17.0000 g | PACK | Freq: Two times a day (BID) | ORAL | Status: DC
  Administered 2019-09-05 – 2019-09-08 (×6): 17 g via ORAL
  Filled 2019-09-05 (×7): qty 1

## 2019-09-05 MED ORDER — BISACODYL 5 MG PO TBEC
10.0000 mg | DELAYED_RELEASE_TABLET | Freq: Every day | ORAL | Status: DC | PRN
  Administered 2019-09-05: 10 mg via ORAL
  Filled 2019-09-05: qty 2

## 2019-09-05 NOTE — Progress Notes (Signed)
PT Cancellation Note  Patient Details Name: Jacob Page MRN: [id number] DOB: May 07, 1955   Cancelled Treatment:    Reason Eval/Treat Not Completed: Other (comment). Pt amb earlier with nursing and wants to rest a while.    Angelina Ok [facility] 09/05/2019, 4:45 PM Skip Mayer PT Acute Rehabilitation Services Pager [phone] Office [phone]

## 2019-09-05 NOTE — Progress Notes (Signed)
Patient able to ambulate in the hallway approximately 400 feet. Sat O2 90% on 5 L [place]. At rest Sat O2 93% on 5 L HFNC.

## 2019-09-05 NOTE — Progress Notes (Signed)
Soap suds enema done and patient tolerated well. Large amount of hard brown stool out. Patient reports that he feels much better. Will continue to assess.

## 2019-09-05 NOTE — Progress Notes (Signed)
Patient complaining of being constipated; after the scheduled medicine and PRN medicine were administered, patient asked if he can have an enema. MD agreed with a soap suds enema. When to administer the enema, patient decided to wait until late tonight.

## 2019-09-05 NOTE — Progress Notes (Signed)
Jacob Page  OIZ:[id number] DOB: 1955-04-02 DOA: 08/28/2019 PCP: Paulina Fusi, MD    Brief Narrative:  64 year old with no significant medical history who was diagnosed with Covid 8/5 who presented with a 4 to 5-day history of progressively worsening shortness of breath and an episode of syncope. His wife had previously been hospitalized with Covid. In the ED he was found to be suffering with acute hypoxic respiratory failure and Covid pneumonia and was admitted.  Significant Events: 8/5 COVID-19 positive  8/9 Admit to [facility] for syncope/hypoxia (3-4 L of oxygen) due to COVID-19 pneumonia 8/10 worsening hypoxia-up to 6 L of oxygen - Actemra ordered 8/10 lower extremity Doppler negative for DVT 8/10 TTE -EF 65-70% with no WMA or significant valvular abnormalities  Date of Positive COVID Test: 8/9 (confirmable in Epic)  Vaccination Status: Unvaccinated  COVID-19 specific Treatment: Steroid 8/9 > Remdesivir 8/9 > Actemra 8/9  Antimicrobials:  None  Subjective:  Patient in bed, appears comfortable, denies any headache, no fever, no chest pain or pressure, no shortness of breath , no abdominal pain. No focal weakness.  Assessment & Plan:  COVID Pneumonia -acute hypoxic respiratory failure - unfortunately he is unvaccinated and got severe disease, he has been treated with Actemra, IV steroids and remdesivir, inflammatory markers have stabilized although he is still quite tenuous and requiring large amounts of oxygen.  Continue to gently taper down steroids, advance activity, encouraged to sit up in chair in daytime and use I-S and flutter valve for pulmonary toiletry.  Recent Labs  Lab 08/30/19 0705 08/30/19 0705 08/31/19 0744 09/01/19 0349 09/02/19 1759 09/03/19 0551 09/04/19 0333  DDIMER 0.69*   < > 1.13* 1.06* 1.24* 1.15* 1.34*  FERRITIN 744*  --  1,118* 701*  --   --   --   CRP 3.9*   < > 1.1* 0.7 1.1* 0.8 1.1*  ALT 33   < > 168* 153* 97* 96* 74*  PROCALCITON  --    --   --   --   --  <0.10 <0.10   < > = values in this interval not displayed.   SpO2: 91 % O2 Flow Rate (L/min): 8 L/min   Syncope - Felt to be either orthostatic or vasovagal -TTE noted EF 65-70% with hyperdynamic LV and no regional wall motion abnormalities.  Mild transaminitis - Felt to be due to Covid and treatment of same -follow trend.  Recent Labs  Lab 08/31/19 0744 09/01/19 0349 09/02/19 1759 09/03/19 0551 09/04/19 0333  AST 110* 51* 33 28 21  ALT 168* 153* 97* 96* 74*  ALKPHOS 58 55 48 52 55  BILITOT 1.0 1.1 1.2 1.1 1.2  PROT 7.0 6.4* 5.8* 6.1* 6.2*  ALBUMIN 3.1* 3.0* 2.8* 2.7* 2.9*     Hypomagnesemia - Corrected with supplementation.  Constipation.  Placed on bowel regimen will monitor.  Steroid-induced hyperglycemia - No prior history of DM and A1c not consistent with DM -trend CBGs and administer insulin as needed if CBGs consistently greater than 200.  CBG (last 3)  Recent Labs    09/04/19 1608 09/04/19 2024 09/05/19 0721  GLUCAP 200* 164* 104*    Obesity - Body mass index is 32.29 kg/m.  Follow with PCP for weight loss.    DVT prophylaxis: Lovenox  Code Status: FULL CODE  Family Communication: Aram Beecham [phone]  on 09/03/2019 voice message left at 10:08 AM.  Called again on 09/05/2019 at 10:56 AM.  Micah Flesher to voicemail again message left.  Status is:  Inpatient  Remains inpatient appropriate because:Inpatient level of care appropriate due to severity of illness   Dispo: The patient is from: Home              Anticipated d/c is to: Home              Anticipated d/c date is: > 3 days              Patient currently is not medically stable to d/c.   Consultants:  none  Objective: Blood pressure 107/69, pulse 71, temperature 98 F (36.7 C), temperature source Oral, resp. rate (!) 22, height 6\' 1"  (1.854 m), weight 111 kg, SpO2 91 %.  Intake/Output Summary (Last 24 hours) at 09/05/2019 1055 Last data filed at 09/05/2019 0926 Gross per 24  hour  Intake 960 ml  Output 1400 ml  Net -440 ml   Filed Weights   08/28/19 1516 09/03/19 0258  Weight: 111.1 kg 111 kg    Examination:  Awake Alert, No new F.N deficits, Normal affect Crown.AT,PERRAL Supple Neck,No JVD, No cervical lymphadenopathy appriciated.  Symmetrical Chest wall movement, Good air movement bilaterally, CTAB RRR,No Gallops, Rubs or new Murmurs, No Parasternal Heave +ve B.Sounds, Abd Soft, No tenderness, No organomegaly appriciated, No rebound - guarding or rigidity. No Cyanosis, Clubbing or edema, No new Rash or bruise  CBC: Recent Labs  Lab 09/02/19 1759 09/03/19 0551 09/04/19 0333  WBC 16.7* 13.8* 15.9*  HGB 15.7 16.7 16.8  HCT 47.3 50.2 50.9  MCV 85.2 87.9 85.8  PLT 417* 291 336   Basic Metabolic Panel: Recent Labs  Lab 08/31/19 0744 09/02/19 1759 09/03/19 0551 09/04/19 0333 09/05/19 0500  NA  --  138 138 138  --   K  --  4.1 4.2 4.0  --   CL  --  100 101 98  --   CO2  --  29 27 28   --   GLUCOSE  --  142* 125* 137*  --   BUN  --  29* 28* 31*  --   CREATININE  --  0.84 0.82 0.86  --   CALCIUM  --  8.5* 8.5* 8.5*  --   MG   < >  --  2.4 2.3 2.3   < > = values in this interval not displayed.   GFR: Estimated Creatinine Clearance: 113.3 mL/min (by C-G formula based on SCr of 0.86 mg/dL).  Liver Function Tests: Recent Labs  Lab 09/01/19 0349 09/02/19 1759 09/03/19 0551 09/04/19 0333  AST 51* 33 28 21  ALT 153* 97* 96* 74*  ALKPHOS 55 48 52 55  BILITOT 1.1 1.2 1.1 1.2  PROT 6.4* 5.8* 6.1* 6.2*  ALBUMIN 3.0* 2.8* 2.7* 2.9*    Coagulation Profile: No results for input(s): INR, PROTIME in the last 168 hours.  HbA1C: Hgb A1c MFr Bld  Date/Time Value Ref Range Status  08/29/2019 07:14 AM 5.8 (H) 4.8 - 5.6 % Final    Comment:    (NOTE) Pre diabetes:          5.7%-6.4%  Diabetes:              >6.4%  Glycemic control for   <7.0% adults with diabetes     CBG: Recent Labs  Lab 09/04/19 0736 09/04/19 1142 09/04/19 1608  09/04/19 2024 09/05/19 0721  GLUCAP 144* 161* 200* 164* 104*    Recent Results (from the past 240 hour(s))  SARS Coronavirus 2 by RT PCR (hospital order, performed in [facility] hospital lab) Nasopharyngeal Nasopharyngeal Swab     Status: Abnormal   Collection Time: 08/28/19  3:24 PM   Specimen: Nasopharyngeal Swab  Result Value Ref Range Status   SARS Coronavirus 2 POSITIVE (A) NEGATIVE Final    Comment: RESULT CALLED TO, READ BACK BY AND VERIFIED WITH: D,SIDBURY @1715  08/28/19 EB (NOTE) SARS-CoV-2 target nucleic acids are DETECTED  SARS-CoV-2 RNA is generally detectable in upper respiratory specimens  during the acute phase of infection.  Positive results are indicative  of the presence of the identified virus, but do not rule out bacterial infection or co-infection with other pathogens not detected by the test.  Clinical correlation with patient history and  other diagnostic information is necessary to determine patient infection status.  The expected result is negative.  Fact Sheet for Patients:   BoilerBrush.com.cyhttps://www.fda.gov/media/136312/download   Fact Sheet for Healthcare Providers:   https://pope.com/https://www.fda.gov/media/136313/download    This test is not yet approved or cleared by the [place] FDA and  has been authorized for detection and/or diagnosis of SARS-CoV-2 by FDA under an Emergency Use Authorization (EUA).  This EUA will remain in effect (meaning this test can be  used) for the duration of  the COVID-19 declaration under Section 564(b)(1) of the Act, 21 U.S.C. section 360-bbb-3(b)(1), unless the authorization is terminated or revoked sooner.  Performed at [facility], [address] [place], [place] [address]   Culture, blood (Routine x 2)     Status: None   Collection Time: 08/28/19  3:24 PM   Specimen: BLOOD  Result Value Ref Range Status   Specimen Description BLOOD SITE NOT SPECIFIED  Final   Special Requests   Final    BOTTLES DRAWN AEROBIC AND  ANAEROBIC Blood Culture adequate volume   Culture   Final    NO GROWTH 5 DAYS Performed at [facility], [address] [place], [place] [address]    Report Status 09/02/2019 FINAL  Final  Culture, blood (Routine x 2)     Status: None   Collection Time: 08/28/19  5:34 PM   Specimen: BLOOD  Result Value Ref Range Status   Specimen Description BLOOD SITE NOT SPECIFIED  Final   Special Requests   Final    BOTTLES DRAWN AEROBIC ONLY Blood Culture results may not be optimal due to an excessive volume of blood received in culture bottles   Culture   Final    NO GROWTH 5 DAYS Performed at [facility], [address] [place], [place] [address]    Report Status 09/02/2019 FINAL  Final  Urine culture     Status: None   Collection Time: 08/28/19  7:54 PM   Specimen: Urine, Random  Result Value Ref Range Status   Specimen Description URINE, RANDOM  Final   Special Requests NONE  Final   Culture   Final    NO GROWTH Performed at [facility], [address] [place], [place] [address]    Report Status 08/29/2019 FINAL  Final     Scheduled Meds: . benzonatate  200 mg Oral TID  . docusate sodium  200 mg Oral BID  . enoxaparin (LOVENOX) injection  55 mg Subcutaneous Q24H  . insulin aspart  0-5 Units Subcutaneous QHS  . insulin aspart  0-9 Units Subcutaneous TID WC  . methylPREDNISolone (SOLU-MEDROL) injection  60 mg Intravenous Daily  . polyethylene glycol  17 g Oral BID      LOS: 8 days  Signature  Susa Raring M.D on 09/05/2019 at 10:55 AM   -  To page go to [email or web address]

## 2019-09-06 DIAGNOSIS — U071 COVID-19: Secondary | ICD-10-CM | POA: Diagnosis not present

## 2019-09-06 LAB — CBC WITH DIFFERENTIAL/PLATELET
Abs Immature Granulocytes: 0.8 10*3/uL — ABNORMAL HIGH (ref 0.00–0.07)
Basophils Absolute: 0.1 10*3/uL (ref 0.0–0.1)
Basophils Relative: 0 %
Eosinophils Absolute: 0.4 10*3/uL (ref 0.0–0.5)
Eosinophils Relative: 2 %
HCT: 51.2 % (ref 39.0–52.0)
Hemoglobin: 17 g/dL (ref 13.0–17.0)
Immature Granulocytes: 4 %
Lymphocytes Relative: 6 %
Lymphs Abs: 1 10*3/uL (ref 0.7–4.0)
MCH: 28.6 pg (ref 26.0–34.0)
MCHC: 33.2 g/dL (ref 30.0–36.0)
MCV: 86.2 fL (ref 80.0–100.0)
Monocytes Absolute: 1.4 10*3/uL — ABNORMAL HIGH (ref 0.1–1.0)
Monocytes Relative: 8 %
Neutro Abs: 14.6 10*3/uL — ABNORMAL HIGH (ref 1.7–7.7)
Neutrophils Relative %: 80 %
Platelets: 298 10*3/uL (ref 150–400)
RBC: 5.94 MIL/uL — ABNORMAL HIGH (ref 4.22–5.81)
RDW: 12.9 % (ref 11.5–15.5)
WBC: 18.2 10*3/uL — ABNORMAL HIGH (ref 4.0–10.5)
nRBC: 0 % (ref 0.0–0.2)

## 2019-09-06 LAB — COMPREHENSIVE METABOLIC PANEL
ALT: 58 U/L — ABNORMAL HIGH (ref 0–44)
AST: 24 U/L (ref 15–41)
Albumin: 2.9 g/dL — ABNORMAL LOW (ref 3.5–5.0)
Alkaline Phosphatase: 52 U/L (ref 38–126)
Anion gap: 12 (ref 5–15)
BUN: 26 mg/dL — ABNORMAL HIGH (ref 8–23)
CO2: 24 mmol/L (ref 22–32)
Calcium: 8.4 mg/dL — ABNORMAL LOW (ref 8.9–10.3)
Chloride: 103 mmol/L (ref 98–111)
Creatinine, Ser: 0.89 mg/dL (ref 0.61–1.24)
GFR calc Af Amer: >60 mL/min (ref >60)
GFR calc non Af Amer: >60 mL/min (ref >60)
Glucose, Bld: 106 mg/dL — ABNORMAL HIGH (ref 70–99)
Potassium: 3.9 mmol/L (ref 3.5–5.1)
Sodium: 139 mmol/L (ref 135–145)
Total Bilirubin: 1.5 mg/dL — ABNORMAL HIGH (ref 0.3–1.2)
Total Protein: 6 g/dL — ABNORMAL LOW (ref 6.5–8.1)

## 2019-09-06 LAB — C-REACTIVE PROTEIN: CRP: <0.5 mg/dL (ref <1.0)

## 2019-09-06 LAB — BRAIN NATRIURETIC PEPTIDE: B Natriuretic Peptide: 36.2 pg/mL (ref 0.0–100.0)

## 2019-09-06 LAB — GLUCOSE, CAPILLARY
Glucose-Capillary: 118 mg/dL — ABNORMAL HIGH (ref 70–99)
Glucose-Capillary: 152 mg/dL — ABNORMAL HIGH (ref 70–99)
Glucose-Capillary: 161 mg/dL — ABNORMAL HIGH (ref 70–99)
Glucose-Capillary: 147 mg/dL — ABNORMAL HIGH (ref 70–99)

## 2019-09-06 LAB — D-DIMER, QUANTITATIVE: D-Dimer, Quant: 1.41 ug/mL-FEU — ABNORMAL HIGH (ref 0.00–0.50)

## 2019-09-06 LAB — MAGNESIUM: Magnesium: 2.4 mg/dL (ref 1.7–2.4)

## 2019-09-06 MED ORDER — FUROSEMIDE 10 MG/ML IJ SOLN
40.0000 mg | Freq: Once | INTRAMUSCULAR | Status: AC
  Administered 2019-09-06: 40 mg via INTRAVENOUS
  Filled 2019-09-06: qty 4

## 2019-09-06 MED ORDER — POTASSIUM CHLORIDE CRYS ER 20 MEQ PO TBCR
20.0000 meq | EXTENDED_RELEASE_TABLET | Freq: Once | ORAL | Status: AC
  Administered 2019-09-06: 20 meq via ORAL
  Filled 2019-09-06: qty 1

## 2019-09-06 NOTE — Progress Notes (Signed)
SATURATION QUALIFICATIONS: (This note is used to comply with regulatory documentation for home oxygen)  Patient Saturations on Room Air at Rest = 89%  Patient Saturations on Room Air while Ambulating = 86%  Patient Saturations on 4 Liters of oxygen while Ambulating = 92%  Please briefly explain why patient needs home oxygen:Pt unable to maintain adequate oxygenation without supplemental O2.   Georga Hacking [facility] PT Acute Rehabilitation Services Pager [phone] Office [phone]

## 2019-09-06 NOTE — TOC Progression Note (Signed)
Transition of Care [facility]) - Progression Note    Patient Details  Name: [name] MRN: [id number] Date of Birth: 1955-08-14  Transition of Care [facility]) CM/SW Contact  Lockie Pares, RN Phone Number: 09/06/2019, 5:14 PM  Clinical Narrative:    Attempted to call patient in room, no answer. PT recommended PT and OT orders in for [facility] will need o2 for home as well. Has [facility]. Checking different companies to see who takes insurance. Frances Furbish does will follow up with patient to give choices. In AM. Already has 3:1    Expected Discharge Plan: Home w Home Health Services Barriers to Discharge: Continued Medical Work up  Expected Discharge Plan and Services Expected Discharge Plan: Home w Home Health Services                         DME Arranged: 3-N-1 DME Agency: [employer] Date DME Agency Contacted: 09/06/19 Time DME Agency Contacted: 1500 Representative spoke with at DME Agency: Maud Deed HH Arranged: PT, OT HH Agency: [facility] Date [facility] Agency Contacted: 09/06/19 Time HH Agency Contacted: 1715 Representative spoke with at [facility] Agency: Lorenza Chick   Social Determinants of Health (SDOH) Interventions    Readmission Risk Interventions No flowsheet data found.

## 2019-09-06 NOTE — TOC Initial Note (Signed)
Transition of Care [facility]) - Initial/Assessment Note    Patient Details  Name: Jacob Page MRN: [id number] Date of Birth: 1955/08/03  Transition of Care [facility]) CM/SW Contact:    Lockie Pares, RN Phone Number: 09/06/2019, 3:03 PM  Clinical Narrative:                 Patient admitted with COVID pneumonia Progressing post 8 days. Planning for DC soon ordered 3:1 will be working to order [facility] PT/OT for strengthening.   Expected Discharge Plan: Home w Home Health Services Barriers to Discharge: Continued Medical Work up   Patient Goals and CMS Choice        Expected Discharge Plan and Services Expected Discharge Plan: Home w Home Health Services                         DME Arranged: 3-N-1 DME Agency: [employer] Date DME Agency Contacted: 09/06/19 Time DME Agency Contacted: 1500 Representative spoke with at DME Agency: Maud Deed            Prior Living Arrangements/Services     Patient language and need for interpreter reviewed:: Yes                 Activities of Daily Living Home Assistive Devices/Equipment: None ADL Screening (condition at time of admission) Patient's cognitive ability adequate to safely complete daily activities?: Yes Is the patient deaf or have difficulty hearing?: No Does the patient have difficulty seeing, even when wearing glasses/contacts?: No Does the patient have difficulty concentrating, remembering, or making decisions?: No Patient able to express need for assistance with ADLs?: No Does the patient have difficulty dressing or bathing?: No Independently performs ADLs?: Yes (appropriate for developmental age) Does the patient have difficulty walking or climbing stairs?: Yes Weakness of Legs: Both Weakness of Arms/Hands: None  Permission Sought/Granted                  Emotional Assessment       Orientation: : Oriented to Self, Oriented to Place, Oriented to  Time, Oriented to Situation   Psych Involvement: No  (comment)  Admission diagnosis:  Multifocal pneumonia [J18.9] Pneumonia due to COVID-19 virus [U07.1, J12.82] COVID-19 [U07.1] Patient Active Problem List   Diagnosis Date Noted   Pneumonia due to COVID-19 virus 08/28/2019   S/P hernia repair 04/26/2019   PCP:  Paulina Fusi, MD Pharmacy:   [facility] [address] [place], [place] - [address] [address] [place] [address] Phone: [phone] Fax: [phone]  [facility] [address], [place] - [address] [place] [address] Phone: [phone] Fax: [phone]     Social Determinants of Health (SDOH) Interventions    Readmission Risk Interventions No flowsheet data found.

## 2019-09-06 NOTE — Progress Notes (Signed)
Physical Therapy Treatment Patient Details Name: Jacob Page MRN: [id number] DOB: 1956-01-17 Today's Date: 09/06/2019    History of Present Illness Pt is a 64 y.o. male diagnoses with COVID-19 (pt unvaccinated) on 08/24/19 now admitted 08/28/19 with worsening SOB and syncopal episode. Workup for acute hypoxic respiratory failure and COVID PNA. Negative DVT. CXR 8/12 with worsening bilateral pulmonary infiltrates. PMH includes OSA. Of note, pt's wife previously hospitalized with COVID.    PT Comments    Pt making excellent progress toward return home.    Follow Up Recommendations  No PT follow up;Supervision - Intermittent     Equipment Recommendations  None recommended by PT    Recommendations for Other Services       Precautions / Restrictions Precautions Precautions: Other (comment) Precaution Comments: can't lift over 50# due to hernia surgery in April    Mobility  Bed Mobility               General bed mobility comments: In recliner upon arrival  Transfers Overall transfer level: Modified independent Equipment used: None Transfers: Sit to/from Stand Sit to Stand: Modified independent (Device/Increase time)            Ambulation/Gait Ambulation/Gait assistance: Supervision Gait Distance (Feet): 450 Feet Assistive device: None Gait Pattern/deviations: Step-through pattern;Decreased stride length Gait velocity: decr Gait velocity interpretation: 1.31 - 2.62 ft/sec, indicative of limited community ambulator General Gait Details: Steady gait.    Stairs             Wheelchair Mobility    Modified Rankin (Stroke Patients Only)       Balance Overall balance assessment: Mild deficits observed, not formally tested                                          Cognition Arousal/Alertness: Awake/alert Behavior During Therapy: WFL for tasks assessed/performed Overall Cognitive Status: Within Functional Limits for tasks assessed                                         Exercises      General Comments General comments (skin integrity, edema, etc.): SpO2 > 90% with amb on 4L O2      Pertinent Vitals/Pain Pain Assessment: No/denies pain    Home Living                      Prior Function            PT Goals (current goals can now be found in the care plan section) Acute Rehab PT Goals Patient Stated Goal: get better Progress towards PT goals: Progressing toward goals    Frequency    Min 3X/week      PT Plan Current plan remains appropriate    Co-evaluation              AM-PAC PT "6 Clicks" Mobility   Outcome Measure  Help needed turning from your back to your side while in a flat bed without using bedrails?: None Help needed moving from lying on your back to sitting on the side of a flat bed without using bedrails?: None Help needed moving to and from a bed to a chair (including a wheelchair)?: None Help needed standing up from a chair using your arms (e.g., wheelchair  or bedside chair)?: None Help needed to walk in hospital room?: None Help needed climbing 3-5 steps with a railing? : A Little 6 Click Score: 23    End of Session Equipment Utilized During Treatment: Oxygen Activity Tolerance: Patient tolerated treatment well Patient left: in chair;with call bell/phone within reach   PT Visit Diagnosis: Other abnormalities of gait and mobility (R26.89)     Time: 1040-1102 PT Time Calculation (min) (ACUTE ONLY): 22 min  Charges:  $Gait Training: 8-22 mins                     [facility] PT Acute Rehabilitation Services Pager [phone] Office [phone]    Jacob Page [facility] 09/06/2019, 11:21 AM

## 2019-09-06 NOTE — Progress Notes (Signed)
Jacob Page  WJX:[id number] DOB: [birth year] DOA: 08/28/2019 PCP: Paulina FusiSchultz, Douglas E, MD    Brief Narrative:  64 year old with no significant medical history who was diagnosed with Covid 8/5 who presented with a 4 to 5-day history of progressively worsening shortness of breath and an episode of syncope. His wife had previously been hospitalized with Covid. In the ED he was found to be suffering with acute hypoxic respiratory failure and Covid pneumonia and was admitted.  Significant Events: 8/5 COVID-19 positive  8/9 [facility]/hypoxia (3-4 L of oxygen) due to COVID-19 pneumonia 8/10 worsening hypoxia-up to 6 L of oxygen - Actemra ordered 8/10 lower extremity Doppler negative for DVT 8/10 TTE -EF 65-70% with no WMA or significant valvular abnormalities  Date of Positive COVID Test: 8/9 (confirmable in Epic)  Vaccination Status: Unvaccinated  COVID-19 specific Treatment: Steroid 8/9 > Remdesivir 8/9 > Actemra 8/9  Antimicrobials:  None  Subjective:  Patient in bed, appears comfortable, denies any headache, no fever, no chest pain or pressure, no shortness of breath , no abdominal pain. No focal weakness.  Assessment & Plan:  COVID Pneumonia -acute hypoxic respiratory failure - unfortunately he is unvaccinated and got severe disease, he has been treated with Actemra, IV steroids and remdesivir, inflammatory markers have stabilized although he is still quite tenuous and requiring large amounts of oxygen.  Continue to gently taper down steroids, advance activity, encouraged to sit up in chair in daytime and use I-S and flutter valve for pulmonary toiletry.  Recent Labs  Lab 08/31/19 0744 08/31/19 0744 09/01/19 0349 09/02/19 1759 09/03/19 0551 09/04/19 0333 09/06/19 0340  DDIMER 1.13*   < > 1.06* 1.24* 1.15* 1.34* 1.41*  FERRITIN 1,118*  --  701*  --   --   --   --   CRP 1.1*   < > 0.7 1.1* 0.8 1.1* <0.5  ALT 168*   < > 153* 97* 96* 74* 58*  PROCALCITON  --    --   --   --  <0.10 <0.10  --    < > = values in this interval not displayed.   SpO2: 91 % O2 Flow Rate (L/min): 4 L/min   Syncope - Felt to be either orthostatic or vasovagal -TTE noted EF 65-70% with hyperdynamic LV and no regional wall motion abnormalities.  Mild transaminitis - Felt to be due to Covid and treatment of same -follow trend.  Recent Labs  Lab 09/01/19 0349 09/02/19 1759 09/03/19 0551 09/04/19 0333 09/06/19 0340  AST 51* 33 28 21 24   ALT 153* 97* 96* 74* 58*  ALKPHOS 55 48 52 55 52  BILITOT 1.1 1.2 1.1 1.2 1.5*  PROT 6.4* 5.8* 6.1* 6.2* 6.0*  ALBUMIN 3.0* 2.8* 2.7* 2.9* 2.9*     Hypomagnesemia - Corrected with supplementation.  Constipation.  Placed on bowel regimen with good results, continue to monitor.  Steroid-induced hyperglycemia - No prior history of DM and A1c not consistent with DM -trend CBGs and administer insulin as needed if CBGs consistently greater than 200.  CBG (last 3)  Recent Labs    09/05/19 1621 09/05/19 2155 09/06/19 0725  GLUCAP 145* 136* 118*    Obesity - Body mass index is 32.29 kg/m.  Follow with PCP for weight loss.    DVT prophylaxis: Lovenox  Code Status: FULL CODE  Family Communication: Aram BeechamCynthia [phone]  on 09/03/2019 voice message left at 10:08 AM.  Called again on 09/05/2019 at 10:56 AM.  Micah FlesherWent to voicemail  again message left.  Status is: Inpatient  Remains inpatient appropriate because:Inpatient level of care appropriate due to severity of illness   Dispo: The patient is from: Home              Anticipated d/c is to: Home              Anticipated d/c date is: > 3 days              Patient currently is not medically stable to d/c.   Consultants:  none  Objective: Blood pressure 111/70, pulse 70, temperature 98.1 F (36.7 C), temperature source Oral, resp. rate 18, height 6\' 1"  (1.854 m), weight 111 kg, SpO2 91 %.  Intake/Output Summary (Last 24 hours) at 09/06/2019 1116 Last data filed at 09/06/2019  0900 Gross per 24 hour  Intake 1220 ml  Output 1650 ml  Net -430 ml   Filed Weights   08/28/19 1516 09/03/19 0258  Weight: 111.1 kg 111 kg    Examination:  Awake Alert, No new F.N deficits, Normal affect [place].AT,PERRAL Supple Neck,No JVD, No cervical lymphadenopathy appriciated.  Symmetrical Chest wall movement, Good air movement bilaterally, few rales RRR,No Gallops, Rubs or new Murmurs, No Parasternal Heave +ve B.Sounds, Abd Soft, No tenderness, No organomegaly appriciated, No rebound - guarding or rigidity. No Cyanosis, Clubbing or edema, No new Rash or bruise  CBC: Recent Labs  Lab 09/03/19 0551 09/04/19 0333 09/06/19 0340  WBC 13.8* 15.9* 18.2*  NEUTROABS  --   --  14.6*  HGB 16.7 16.8 17.0  HCT 50.2 50.9 51.2  MCV 87.9 85.8 86.2  PLT 291 336 298   Basic Metabolic Panel: Recent Labs  Lab 09/03/19 0551 09/03/19 0551 09/04/19 0333 09/05/19 0500 09/06/19 0340  NA 138  --  138  --  139  K 4.2  --  4.0  --  3.9  CL 101  --  98  --  103  CO2 27  --  28  --  24  GLUCOSE 125*  --  137*  --  106*  BUN 28*  --  31*  --  26*  CREATININE 0.82  --  0.86  --  0.89  CALCIUM 8.5*  --  8.5*  --  8.4*  MG 2.4   < > 2.3 2.3 2.4   < > = values in this interval not displayed.   GFR: Estimated Creatinine Clearance: 109.5 mL/min (by C-G formula based on SCr of 0.89 mg/dL).  Liver Function Tests: Recent Labs  Lab 09/02/19 1759 09/03/19 0551 09/04/19 0333 09/06/19 0340  AST 33 28 21 24   ALT 97* 96* 74* 58*  ALKPHOS 48 52 55 52  BILITOT 1.2 1.1 1.2 1.5*  PROT 5.8* 6.1* 6.2* 6.0*  ALBUMIN 2.8* 2.7* 2.9* 2.9*    Coagulation Profile: No results for input(s): INR, PROTIME in the last 168 hours.  HbA1C: Hgb A1c MFr Bld  Date/Time Value Ref Range Status  08/29/2019 07:14 AM 5.8 (H) 4.8 - 5.6 % Final    Comment:    (NOTE) Pre diabetes:          5.7%-6.4%  Diabetes:              >6.4%  Glycemic control for   <7.0% adults with diabetes     CBG: Recent Labs    Lab 09/05/19 0721 09/05/19 1205 09/05/19 1621 09/05/19 2155 09/06/19 0725  GLUCAP 104* 136* 145* 136* 118*    Recent Results (from the past  240 hour(s))  SARS Coronavirus 2 by RT PCR (hospital order, performed in [facility] hospital lab) Nasopharyngeal Nasopharyngeal Swab     Status: Abnormal   Collection Time: 08/28/19  3:24 PM   Specimen: Nasopharyngeal Swab  Result Value Ref Range Status   SARS Coronavirus 2 POSITIVE (A) NEGATIVE Final    Comment: RESULT CALLED TO, READ BACK BY AND VERIFIED WITH: D,SIDBURY @1715  08/28/19 EB (NOTE) SARS-CoV-2 target nucleic acids are DETECTED  SARS-CoV-2 RNA is generally detectable in upper respiratory specimens  during the acute phase of infection.  Positive results are indicative  of the presence of the identified virus, but do not rule out bacterial infection or co-infection with other pathogens not detected by the test.  Clinical correlation with patient history and  other diagnostic information is necessary to determine patient infection status.  The expected result is negative.  Fact Sheet for Patients:   10/28/19   Fact Sheet for Healthcare Providers:   BoilerBrush.com.cy    This test is not yet approved or cleared by the https://pope.com/ FDA and  has been authorized for detection and/or diagnosis of SARS-CoV-2 by FDA under an Emergency Use Authorization (EUA).  This EUA will remain in effect (meaning this test can be  used) for the duration of  the COVID-19 declaration under Section 564(b)(1) of the Act, 21 U.S.C. section 360-bbb-3(b)(1), unless the authorization is terminated or revoked sooner.  Performed at [facility] Lab, [address], [place], [place]   Culture, blood (Routine x 2)     Status: None   Collection Time: 08/28/19  3:24 PM   Specimen: BLOOD  Result Value Ref Range Status   Specimen Description BLOOD SITE NOT SPECIFIED  Final   Special  Requests   Final    BOTTLES DRAWN AEROBIC AND ANAEROBIC Blood Culture adequate volume   Culture   Final    NO GROWTH 5 DAYS Performed at [facility], [address] [place], [place]    Report Status 09/02/2019 FINAL  Final  Culture, blood (Routine x 2)     Status: None   Collection Time: 08/28/19  5:34 PM   Specimen: BLOOD  Result Value Ref Range Status   Specimen Description BLOOD SITE NOT SPECIFIED  Final   Special Requests   Final    BOTTLES DRAWN AEROBIC ONLY Blood Culture results may not be optimal due to an excessive volume of blood received in culture bottles   Culture   Final    NO GROWTH 5 DAYS Performed at [facility] Lab, [address], [place], [place]    Report Status 09/02/2019 FINAL  Final  Urine culture     Status: None   Collection Time: 08/28/19  7:54 PM   Specimen: Urine, Random  Result Value Ref Range Status   Specimen Description URINE, RANDOM  Final   Special Requests NONE  Final   Culture   Final    NO GROWTH Performed at [facility] Lab, [address], [place], [place]    Report Status 08/29/2019 FINAL  Final     Scheduled Meds: . benzonatate  200 mg Oral TID  . docusate sodium  200 mg Oral BID  . enoxaparin (LOVENOX) injection  55 mg Subcutaneous Q24H  . insulin aspart  0-5 Units Subcutaneous QHS  . insulin aspart  0-9 Units Subcutaneous TID WC  . methylPREDNISolone (SOLU-MEDROL) injection  60 mg Intravenous Daily  . polyethylene glycol  17 g Oral BID      LOS: 9 days   Signature  Susa Raring M.D on 09/06/2019 at 11:16 AM   -  To page go to [email or web address]

## 2019-09-07 DIAGNOSIS — U071 COVID-19: Secondary | ICD-10-CM | POA: Diagnosis not present

## 2019-09-07 LAB — COMPREHENSIVE METABOLIC PANEL
ALT: 67 U/L — ABNORMAL HIGH (ref 0–44)
AST: 26 U/L (ref 15–41)
Albumin: 2.8 g/dL — ABNORMAL LOW (ref 3.5–5.0)
Alkaline Phosphatase: 51 U/L (ref 38–126)
Anion gap: 9 (ref 5–15)
BUN: 31 mg/dL — ABNORMAL HIGH (ref 8–23)
CO2: 28 mmol/L (ref 22–32)
Calcium: 8.4 mg/dL — ABNORMAL LOW (ref 8.9–10.3)
Chloride: 103 mmol/L (ref 98–111)
Creatinine, Ser: 1.04 mg/dL (ref 0.61–1.24)
GFR calc Af Amer: >60 mL/min (ref >60)
GFR calc non Af Amer: >60 mL/min (ref >60)
Glucose, Bld: 110 mg/dL — ABNORMAL HIGH (ref 70–99)
Potassium: 4.3 mmol/L (ref 3.5–5.1)
Sodium: 140 mmol/L (ref 135–145)
Total Bilirubin: 1.9 mg/dL — ABNORMAL HIGH (ref 0.3–1.2)
Total Protein: 5.8 g/dL — ABNORMAL LOW (ref 6.5–8.1)

## 2019-09-07 LAB — CBC WITH DIFFERENTIAL/PLATELET
Abs Immature Granulocytes: 0.5 10*3/uL — ABNORMAL HIGH (ref 0.00–0.07)
Basophils Absolute: 0.1 10*3/uL (ref 0.0–0.1)
Basophils Relative: 0 %
Eosinophils Absolute: 0.4 10*3/uL (ref 0.0–0.5)
Eosinophils Relative: 3 %
HCT: 52.6 % — ABNORMAL HIGH (ref 39.0–52.0)
Hemoglobin: 17.2 g/dL — ABNORMAL HIGH (ref 13.0–17.0)
Immature Granulocytes: 3 %
Lymphocytes Relative: 10 %
Lymphs Abs: 1.5 10*3/uL (ref 0.7–4.0)
MCH: 28.2 pg (ref 26.0–34.0)
MCHC: 32.7 g/dL (ref 30.0–36.0)
MCV: 86.2 fL (ref 80.0–100.0)
Monocytes Absolute: 1.4 10*3/uL — ABNORMAL HIGH (ref 0.1–1.0)
Monocytes Relative: 9 %
Neutro Abs: 11.7 10*3/uL — ABNORMAL HIGH (ref 1.7–7.7)
Neutrophils Relative %: 75 %
Platelets: 253 10*3/uL (ref 150–400)
RBC: 6.1 MIL/uL — ABNORMAL HIGH (ref 4.22–5.81)
RDW: 13 % (ref 11.5–15.5)
WBC: 15.6 10*3/uL — ABNORMAL HIGH (ref 4.0–10.5)
nRBC: 0 % (ref 0.0–0.2)

## 2019-09-07 LAB — D-DIMER, QUANTITATIVE: D-Dimer, Quant: 1.26 ug/mL-FEU — ABNORMAL HIGH (ref 0.00–0.50)

## 2019-09-07 LAB — C-REACTIVE PROTEIN: CRP: <0.5 mg/dL (ref <1.0)

## 2019-09-07 LAB — GLUCOSE, CAPILLARY
Glucose-Capillary: 118 mg/dL — ABNORMAL HIGH (ref 70–99)
Glucose-Capillary: 154 mg/dL — ABNORMAL HIGH (ref 70–99)
Glucose-Capillary: 165 mg/dL — ABNORMAL HIGH (ref 70–99)
Glucose-Capillary: 146 mg/dL — ABNORMAL HIGH (ref 70–99)

## 2019-09-07 MED ORDER — METHYLPREDNISOLONE SODIUM SUCC 40 MG IJ SOLR
20.0000 mg | Freq: Every day | INTRAMUSCULAR | Status: DC
  Administered 2019-09-08: 20 mg via INTRAVENOUS
  Filled 2019-09-07: qty 1

## 2019-09-07 NOTE — Progress Notes (Signed)
Jacob Page  WUJ:[id number] DOB: October 01, 1955 DOA: 08/28/2019 PCP: Paulina Fusi, MD    Brief Narrative:  64 year old with no significant medical history who was diagnosed with Covid 8/5 who presented with a 4 to 5-day history of progressively worsening shortness of breath and an episode of syncope. His wife had previously been hospitalized with Covid. In the ED he was found to be suffering with acute hypoxic respiratory failure and Covid pneumonia and was admitted.  Significant Events: 8/5 COVID-19 positive  8/9 Admit to [facility] for syncope/hypoxia (3-4 L of oxygen) due to COVID-19 pneumonia 8/10 worsening hypoxia-up to 6 L of oxygen - Actemra ordered 8/10 lower extremity Doppler negative for DVT 8/10 TTE -EF 65-70% with no WMA or significant valvular abnormalities  Date of Positive COVID Test: 8/9 (confirmable in Epic)  Vaccination Status: Unvaccinated  COVID-19 specific Treatment: Steroid 8/9 > Remdesivir 8/9 > Actemra 8/9  Antimicrobials:  None  Subjective:  Patient in bed, appears comfortable, denies any headache, no fever, no chest pain or pressure, no shortness of breath , no abdominal pain. No focal weakness.   Assessment & Plan:  COVID Pneumonia -acute hypoxic respiratory failure - unfortunately he is unvaccinated and got severe disease, he has been treated with Actemra, IV steroids and remdesivir, inflammatory markers have stabilized although he is still quite tenuous and requiring large amounts of oxygen.  Continue to gently taper down steroids further, advance activity, encouraged to sit up in chair in daytime and use I-S and flutter valve for pulmonary toiletry.  Recent Labs  Lab 09/01/19 0349 09/01/19 0349 09/02/19 1759 09/03/19 0551 09/04/19 0333 09/06/19 0340 09/07/19 0500  DDIMER 1.06*   < > 1.24* 1.15* 1.34* 1.41* 1.26*  FERRITIN 701*  --   --   --   --   --   --   CRP 0.7   < > 1.1* 0.8 1.1* <0.5 <0.5  ALT 153*   < > 97* 96* 74* 58* 67*   PROCALCITON  --   --   --  <0.10 <0.10  --   --    < > = values in this interval not displayed.   SpO2: 94 % O2 Flow Rate (L/min): 3 L/min   Syncope - Felt to be either orthostatic or vasovagal -TTE noted EF 65-70% with hyperdynamic LV and no regional wall motion abnormalities.  Mild transaminitis - Felt to be due to Covid and treatment of same -follow trend.  Recent Labs  Lab 09/02/19 1759 09/03/19 0551 09/04/19 0333 09/06/19 0340 09/07/19 0500  AST 33 28 21 24 26   ALT 97* 96* 74* 58* 67*  ALKPHOS 48 52 55 52 51  BILITOT 1.2 1.1 1.2 1.5* 1.9*  PROT 5.8* 6.1* 6.2* 6.0* 5.8*  ALBUMIN 2.8* 2.7* 2.9* 2.9* 2.8*     Hypomagnesemia - Corrected with supplementation.  Constipation.  Placed on bowel regimen with good results, continue to monitor.  Steroid-induced hyperglycemia - No prior history of DM and A1c not consistent with DM -trend CBGs and administer insulin as needed if CBGs consistently greater than 200.  CBG (last 3)  Recent Labs    09/06/19 1610 09/06/19 2100 09/07/19 0733  GLUCAP 161* 147* 118*    Obesity - Body mass index is 32.29 kg/m.  Follow with PCP for weight loss.    DVT prophylaxis: Lovenox  Code Status: FULL CODE  Family Communication: 09/09/19 [phone]  on 09/03/2019 voice message left at 10:08 AM.  Called again on 09/05/2019 at 10:56 AM.  Went to voicemail again message left.  Status is: Inpatient  Remains inpatient appropriate because:Inpatient level of care appropriate due to severity of illness   Dispo: The patient is from: Home              Anticipated d/c is to: Home              Anticipated d/c date is: > 3 days              Patient currently is not medically stable to d/c.   Consultants:  none  Objective: Blood pressure 106/72, pulse 70, temperature 97.6 F (36.4 C), resp. rate 20, height 6\' 1"  (1.854 m), weight 111 kg, SpO2 94 %.  Intake/Output Summary (Last 24 hours) at 09/07/2019 1018 Last data filed at 09/07/2019  0900 Gross per 24 hour  Intake 600 ml  Output 1200 ml  Net -600 ml   Filed Weights   08/28/19 1516 09/03/19 0258  Weight: 111.1 kg 111 kg    Examination:  Awake Alert, No new F.N deficits, Normal affect [place].AT,PERRAL Supple Neck,No JVD, No cervical lymphadenopathy appriciated.  Symmetrical Chest wall movement, Good air movement bilaterally, CTAB RRR,No Gallops, Rubs or new Murmurs, No Parasternal Heave +ve B.Sounds, Abd Soft, No tenderness, No organomegaly appriciated, No rebound - guarding or rigidity. No Cyanosis, Clubbing or edema, No new Rash or bruise   CBC: Recent Labs  Lab 09/04/19 0333 09/06/19 0340 09/07/19 0500  WBC 15.9* 18.2* 15.6*  NEUTROABS  --  14.6* 11.7*  HGB 16.8 17.0 17.2*  HCT 50.9 51.2 52.6*  MCV 85.8 86.2 86.2  PLT 336 298 253   Basic Metabolic Panel: Recent Labs  Lab 09/04/19 0333 09/05/19 0500 09/06/19 0340 09/07/19 0500  NA 138  --  139 140  K 4.0  --  3.9 4.3  CL 98  --  103 103  CO2 28  --  24 28  GLUCOSE 137*  --  106* 110*  BUN 31*  --  26* 31*  CREATININE 0.86  --  0.89 1.04  CALCIUM 8.5*  --  8.4* 8.4*  MG 2.3 2.3 2.4  --    GFR: Estimated Creatinine Clearance: 93.7 mL/min (by C-G formula based on SCr of 1.04 mg/dL).  Liver Function Tests: Recent Labs  Lab 09/03/19 0551 09/04/19 0333 09/06/19 0340 09/07/19 0500  AST 28 21 24 26   ALT 96* 74* 58* 67*  ALKPHOS 52 55 52 51  BILITOT 1.1 1.2 1.5* 1.9*  PROT 6.1* 6.2* 6.0* 5.8*  ALBUMIN 2.7* 2.9* 2.9* 2.8*    Coagulation Profile: No results for input(s): INR, PROTIME in the last 168 hours.  HbA1C: Hgb A1c MFr Bld  Date/Time Value Ref Range Status  08/29/2019 07:14 AM 5.8 (H) 4.8 - 5.6 % Final    Comment:    (NOTE) Pre diabetes:          5.7%-6.4%  Diabetes:              >6.4%  Glycemic control for   <7.0% adults with diabetes     CBG: Recent Labs  Lab 09/06/19 0725 09/06/19 1147 09/06/19 1610 09/06/19 2100 09/07/19 0733  GLUCAP 118* 152* 161* 147*  118*    Recent Results (from the past 240 hour(s))  SARS Coronavirus 2 by RT PCR (hospital order, performed in [facility] hospital lab) Nasopharyngeal Nasopharyngeal Swab     Status: Abnormal   Collection Time: 08/28/19  3:24 PM   Specimen: Nasopharyngeal Swab  Result Value  Ref Range Status   SARS Coronavirus 2 POSITIVE (A) NEGATIVE Final    Comment: RESULT CALLED TO, READ BACK BY AND VERIFIED WITH: D,SIDBURY @1715  08/28/19 EB (NOTE) SARS-CoV-2 target nucleic acids are DETECTED  SARS-CoV-2 RNA is generally detectable in upper respiratory specimens  during the acute phase of infection.  Positive results are indicative  of the presence of the identified virus, but do not rule out bacterial infection or co-infection with other pathogens not detected by the test.  Clinical correlation with patient history and  other diagnostic information is necessary to determine patient infection status.  The expected result is negative.  Fact Sheet for Patients:   10/28/19   Fact Sheet for Healthcare Providers:   BoilerBrush.com.cy    This test is not yet approved or cleared by the https://pope.com/ FDA and  has been authorized for detection and/or diagnosis of SARS-CoV-2 by FDA under an Emergency Use Authorization (EUA).  This EUA will remain in effect (meaning this test can be  used) for the duration of  the COVID-19 declaration under Section 564(b)(1) of the Act, 21 U.S.C. section 360-bbb-3(b)(1), unless the authorization is terminated or revoked sooner.  Performed at [facility], [address], [place], [place]   Culture, blood (Routine x 2)     Status: None   Collection Time: 08/28/19  3:24 PM   Specimen: BLOOD  Result Value Ref Range Status   Specimen Description BLOOD SITE NOT SPECIFIED  Final   Special Requests   Final    BOTTLES DRAWN AEROBIC AND ANAEROBIC Blood Culture adequate volume   Culture   Final    NO  GROWTH 5 DAYS Performed at [facility], [address], [place], [place]    Report Status 09/02/2019 FINAL  Final  Culture, blood (Routine x 2)     Status: None   Collection Time: 08/28/19  5:34 PM   Specimen: BLOOD  Result Value Ref Range Status   Specimen Description BLOOD SITE NOT SPECIFIED  Final   Special Requests   Final    BOTTLES DRAWN AEROBIC ONLY Blood Culture results may not be optimal due to an excessive volume of blood received in culture bottles   Culture   Final    NO GROWTH 5 DAYS Performed at [facility], [address] [place], [place]    Report Status 09/02/2019 FINAL  Final  Urine culture     Status: None   Collection Time: 08/28/19  7:54 PM   Specimen: Urine, Random  Result Value Ref Range Status   Specimen Description URINE, RANDOM  Final   Special Requests NONE  Final   Culture   Final    NO GROWTH Performed at [facility], [address], [place], [place]    Report Status 08/29/2019 FINAL  Final     Scheduled Meds: . benzonatate  200 mg Oral TID  . docusate sodium  200 mg Oral BID  . enoxaparin (LOVENOX) injection  55 mg Subcutaneous Q24H  . insulin aspart  0-5 Units Subcutaneous QHS  . insulin aspart  0-9 Units Subcutaneous TID WC  . [START ON 09/08/2019] methylPREDNISolone (SOLU-MEDROL) injection  20 mg Intravenous Daily  . polyethylene glycol  17 g Oral BID      LOS: 10 days   Signature  09/10/2019 M.D on 09/07/2019 at 10:18 AM   -  To page go to [email or web address]

## 2019-09-07 NOTE — TOC Transition Note (Signed)
Transition of Care [facility]) - CM/SW Discharge Note   Patient Details  Name: Jacob Page MRN: [id number] Date of Birth: 12-10-1955  Transition of Care [facility]) CM/SW Contact:  Lockie Pares, RN Phone Number: 09/07/2019, 9:01 AM   Clinical Narrative:    Spoke to patient regarding home health and PT OT as well a DME o2 gave choices website for [facility] and  Choices for O@. Patient prefers to go with who would approve through insurance for home health. Chose [employer] for oxygen. Frances Furbish will take patient for [facility]   Final next level of care: Home w Home Health Services Barriers to Discharge: No Barriers Identified   Patient Goals and CMS Choice Patient states their goals for this hospitalization and ongoing recovery are:: To go home get better   Choice offered to / list presented to : Patient  Discharge Placement                       Discharge Plan and Services                DME Arranged: Oxygen DME Agency: [employer] Date DME Agency Contacted: 09/07/19 Time DME Agency Contacted: [phone] Representative spoke with at DME Agency: zach blank HH Arranged: PT, OT HH Agency: [facility] Date [facility] Agency Contacted: 09/06/19 Time HH Agency Contacted: 1715 Representative spoke with at [facility] Agency: Lorenza Chick  Social Determinants of Health (SDOH) Interventions     Readmission Risk Interventions No flowsheet data found.

## 2019-09-08 LAB — CBC WITH DIFFERENTIAL/PLATELET
Abs Immature Granulocytes: 0.29 10*3/uL — ABNORMAL HIGH (ref 0.00–0.07)
Basophils Absolute: 0.1 10*3/uL (ref 0.0–0.1)
Basophils Relative: 0 %
Eosinophils Absolute: 0.3 10*3/uL (ref 0.0–0.5)
Eosinophils Relative: 2 %
HCT: 53.6 % — ABNORMAL HIGH (ref 39.0–52.0)
Hemoglobin: 17.5 g/dL — ABNORMAL HIGH (ref 13.0–17.0)
Immature Granulocytes: 2 %
Lymphocytes Relative: 10 %
Lymphs Abs: 1.3 10*3/uL (ref 0.7–4.0)
MCH: 28.4 pg (ref 26.0–34.0)
MCHC: 32.6 g/dL (ref 30.0–36.0)
MCV: 86.9 fL (ref 80.0–100.0)
Monocytes Absolute: 1.1 10*3/uL — ABNORMAL HIGH (ref 0.1–1.0)
Monocytes Relative: 8 %
Neutro Abs: 10.7 10*3/uL — ABNORMAL HIGH (ref 1.7–7.7)
Neutrophils Relative %: 78 %
Platelets: 245 10*3/uL (ref 150–400)
RBC: 6.17 MIL/uL — ABNORMAL HIGH (ref 4.22–5.81)
RDW: 13.2 % (ref 11.5–15.5)
WBC: 13.7 10*3/uL — ABNORMAL HIGH (ref 4.0–10.5)
nRBC: 0 % (ref 0.0–0.2)

## 2019-09-08 LAB — COMPREHENSIVE METABOLIC PANEL
ALT: 61 U/L — ABNORMAL HIGH (ref 0–44)
AST: 23 U/L (ref 15–41)
Albumin: 3.1 g/dL — ABNORMAL LOW (ref 3.5–5.0)
Alkaline Phosphatase: 52 U/L (ref 38–126)
Anion gap: 12 (ref 5–15)
BUN: 27 mg/dL — ABNORMAL HIGH (ref 8–23)
CO2: 25 mmol/L (ref 22–32)
Calcium: 8.6 mg/dL — ABNORMAL LOW (ref 8.9–10.3)
Chloride: 103 mmol/L (ref 98–111)
Creatinine, Ser: 0.96 mg/dL (ref 0.61–1.24)
GFR calc Af Amer: >60 mL/min (ref >60)
GFR calc non Af Amer: >60 mL/min (ref >60)
Glucose, Bld: 126 mg/dL — ABNORMAL HIGH (ref 70–99)
Potassium: 3.8 mmol/L (ref 3.5–5.1)
Sodium: 140 mmol/L (ref 135–145)
Total Bilirubin: 2 mg/dL — ABNORMAL HIGH (ref 0.3–1.2)
Total Protein: 6.2 g/dL — ABNORMAL LOW (ref 6.5–8.1)

## 2019-09-08 LAB — GLUCOSE, CAPILLARY
Glucose-Capillary: 109 mg/dL — ABNORMAL HIGH (ref 70–99)
Glucose-Capillary: 135 mg/dL — ABNORMAL HIGH (ref 70–99)

## 2019-09-08 LAB — D-DIMER, QUANTITATIVE: D-Dimer, Quant: 1.24 ug/mL-FEU — ABNORMAL HIGH (ref 0.00–0.50)

## 2019-09-08 LAB — C-REACTIVE PROTEIN: CRP: <0.5 mg/dL (ref <1.0)

## 2019-09-08 MED ORDER — ALBUTEROL SULFATE HFA 108 (90 BASE) MCG/ACT IN AERS
1.0000 | INHALATION_SPRAY | Freq: Four times a day (QID) | RESPIRATORY_TRACT | 0 refills | Status: DC | PRN
Start: 2019-09-08 — End: 2020-01-01

## 2019-09-08 MED ORDER — FUROSEMIDE 10 MG/ML IJ SOLN
40.0000 mg | Freq: Once | INTRAMUSCULAR | Status: AC
  Administered 2019-09-08: 40 mg via INTRAVENOUS
  Filled 2019-09-08: qty 4

## 2019-09-08 NOTE — Progress Notes (Signed)
Occupational Therapy Treatment Patient Details Name: Jacob Page MRN: [id number] DOB: 12/04/1955 Today's Date: 09/08/2019    History of present illness Pt is a 64 y.o. male diagnoses with COVID-19 (pt unvaccinated) on 08/24/19 now admitted 08/28/19 with worsening SOB and syncopal episode. Workup for acute hypoxic respiratory failure and COVID PNA. Negative DVT. CXR 8/12 with worsening bilateral pulmonary infiltrates. PMH includes OSA. Of note, pt's wife previously hospitalized with COVID.   OT comments  Pt progressing towards established OT goals. Providing pt with education and handout on [facility] for ADLs and IADLs; pt verbalized understanding. Pt donning clothes with Supervision and demonstrating understanding of EC strategies. Pt performing functional mobility in hallway with Supervision. Maintaining SpO2 in >86% on 2L O2. Continue to recommend dc to home with HHOT. Answered all questions in preparation for dc later today.    Follow Up Recommendations  Home health OT;Supervision - Intermittent (Pending progress)    Equipment Recommendations  3 in 1 bedside commode    Recommendations for Other Services      Precautions / Restrictions Precautions Precautions: Other (comment) Precaution Comments: can't lift over 50# due to hernia surgery in April       Mobility Bed Mobility Overal bed mobility: Modified Independent                Transfers Overall transfer level: Modified independent     Sit to Stand: Modified independent (Device/Increase time)              Balance Overall balance assessment: Mild deficits observed, not formally tested Sitting-balance support: No upper extremity supported;Feet supported Sitting balance-Leahy Scale: Good     Standing balance support: No upper extremity supported;During functional activity Standing balance-Leahy Scale: Good                             ADL either performed or assessed with clinical judgement   ADL Overall  ADL's : Needs assistance/impaired                 Upper Body Dressing : Set up;Sitting Upper Body Dressing Details (indicate cue type and reason): Donned shirt Lower Body Dressing: Supervision/safety;Sit to/from stand Lower Body Dressing Details (indicate cue type and reason): Donned pants           Tub/Shower Transfer Details (indicate cue type and reason): Education on use of 3N1 after shower seat in walk in shower Functional mobility during ADLs: Supervision/safety General ADL Comments: Pt performing dressing at EOB. Then performing functional mobility in hallway. Providing handout and education on energy conservation for ADLs and IADLs     Vision       Perception     Praxis      Cognition Arousal/Alertness: Awake/alert Behavior During Therapy: WFL for tasks assessed/performed Overall Cognitive Status: Within Functional Limits for tasks assessed                                 General Comments: Very motivated        Exercises Other Exercises Other Exercises: pursed lip breathing with arm movements   Shoulder Instructions       General Comments SpO2 dropping to 82% on RA. Elevating to 86% on 2L. HR 90s-100s with activity    Pertinent Vitals/ Pain       Pain Assessment: No/denies pain  Home Living  Prior Functioning/Environment              Frequency  Min 2X/week        Progress Toward Goals  OT Goals(current goals can now be found in the care plan section)  Progress towards OT goals: Progressing toward goals  Acute Rehab OT Goals Patient Stated Goal: get better OT Goal Formulation: With patient Time For Goal Achievement: 09/16/19 Potential to Achieve Goals: Good ADL Goals Pt Will Perform Lower Body Bathing: with modified independence;sit to/from stand Pt Will Perform Lower Body Dressing: with modified independence;sit to/from stand Pt Will Transfer to Toilet:  with modified independence;ambulating Additional ADL Goal #1: Independently verbalize 3 energy conservation strategies Additional ADL Goal #2: Demonstrate 2 relaxation strategies with min vc to help manage anxiety during ADL tasks and mobility  Plan Discharge plan remains appropriate    Co-evaluation                 AM-PAC OT "6 Clicks" Daily Activity     Outcome Measure   Help from another person eating meals?: None Help from another person taking care of personal grooming?: A Little Help from another person toileting, which includes using toliet, bedpan, or urinal?: A Little Help from another person bathing (including washing, rinsing, drying)?: A Little Help from another person to put on and taking off regular upper body clothing?: A Little Help from another person to put on and taking off regular lower body clothing?: A Little 6 Click Score: 19    End of Session Equipment Utilized During Treatment: Oxygen (2L)  OT Visit Diagnosis: Unsteadiness on feet (R26.81);Muscle weakness (generalized) (M62.81);Pain Pain - part of body:  (chest)   Activity Tolerance Patient tolerated treatment well   Patient Left in bed;with call bell/phone within reach   Nurse Communication Mobility status        Time: 1124-1203 OT Time Calculation (min): 39 min  Charges: OT General Charges $OT Visit: 1 Visit OT Treatments $Self Care/Home Management : 38-52 mins  Travaughn Vue MSOT, OTR/L Acute Rehab Pager: [phone] Office: [phone]   Theodoro Grist Bradshaw Minihan 09/08/2019, 2:45 PM

## 2019-09-08 NOTE — Progress Notes (Signed)
Fredrich Birks to be D/C'd home per MD order. Discussed with the patient and all questions fully answered.   VVS, Skin clean, dry and intact without evidence of skin break down, no evidence of skin tears noted.  IV catheter discontinued intact. Site without signs and symptoms of complications. Dressing and pressure applied.  An After Visit Summary was printed and given to the patient.  Pt was discharged with 4 tanks of oxygen. Patient escorted via WC, and D/C home via private auto.  Dickie La  09/08/2019 3:14 PM

## 2019-09-08 NOTE — Discharge Summary (Signed)
Jacob Page LYY:[id number] DOB: 1955/02/06 DOA: 08/28/2019  PCP: Nicoletta Dress, MD  Admit date: 08/28/2019  Discharge date: 09/08/2019  Admitted From: Home   Disposition:  Home   Recommendations for Outpatient Follow-up:   Follow up with PCP in 1-2 weeks  PCP Please obtain BMP/CBC, 2 view CXR in 1week,  (see Discharge instructions)   PCP Please follow up on the following pending results: CBC, CMP, 2 view chest x-ray in a week.   Home Health: PT,RN   Equipment/Devices: 4lit [place] o2  Consultations: None  Discharge Condition: Stable    CODE STATUS: Full    Diet Recommendation: Heart Healthy   Diet Order            Diet regular Room service appropriate? No; Fluid consistency: Thin  Diet effective now                  Chief Complaint  Patient presents with  . Weakness  . Shortness of Breath  . Cough     Brief history of present illness from the day of admission and additional interim summary    64 year old with no significant medical history who was diagnosed with Covid 8/5 who presented with a 4 to 5-day history of progressively worsening shortness of breath and an episode of syncope. His wife had previously been hospitalized with Covid. In the ED he was found to be suffering with acute hypoxic respiratory failure and Covid pneumonia and was admitted.  Significant Events: 8/5 COVID-19 positive  8/9 Admit to [facility] forsyncope/hypoxia (3-4 L of oxygen) due to COVID-19 pneumonia 8/10 worsening hypoxia-up to 6 L of oxygen - Actemra ordered 8/10 lower extremity Doppler negative for DVT 8/10 TTE -EF 65-70% with no WMA or significant valvular abnormalities  Date of Positive COVID Test: 8/9 (confirmable in Epic)  Vaccination Status: Unvaccinated  COVID-19 specific Treatment: Steroid 8/9  > Remdesivir 8/9 > Actemra 8/9                                                                 Hospital Course   COVID Pneumonia -acute hypoxic respiratory failure - unfortunately he is unvaccinated and got severe disease, he has been treated with Actemra, IV steroids and remdesivir, inflammatory markers have stabilized clinically he is much improved with requirements of 3 to 4 L nasal cannula oxygen at rest, he is symptom-free, eager to go home will be discharged home with pain 3 to 4 L of nasal cannula oxygen which he can titrate at home.  Follow with PCP within a week.Marland Kitchen   SpO2: 91 % O2 Flow Rate (L/min): 3 L/min  Recent Labs  Lab 09/03/19 0551 09/04/19 0333 09/05/19 0605 09/06/19 0340 09/07/19 0500 09/08/19 0853  CRP 0.8 1.1*  --  <0.5 <0.5 <0.5  DDIMER 1.15* 1.34*  --  1.41* 1.26* 1.24*  BNP 252.7* 31.8 37.0 36.2  --   --   PROCALCITON <0.10 <0.10  --   --   --   --     Hepatic Function Latest Ref Rng & Units 09/08/2019 09/07/2019 09/06/2019  Total Protein 6.5 - 8.1 g/dL 6.2(L) 5.8(L) 6.0(L)  Albumin 3.5 - 5.0 g/dL 3.1(L) 2.8(L) 2.9(L)  AST 15 - 41 U/L _0 ALT 0 - 44 U/L 61(H) 67(H) 58(H)  Alk Phosphatase 38 - 126 U/L 52 51 52  Total Bilirubin 0.3 - 1.2 mg/dL 2.0(H) 1.9(H) 1.5(H)   Syncope - Felt to be either orthostatic or vasovagal -TTE noted EF 65-70% with hyperdynamic LV and no regional wall motion abnormalities.  Mild transaminitis - Felt to be due to Covid and treatment of same, stable trend PCP to recheck CMP in 7 to 10 days.  Obesity - Body mass index is 32.29 kg/m.  Follow with PCP for weight loss.  Hypomagnesemia - Corrected with supplementation.  Constipation.  Placed on bowel regimen with good results, continue to monitor.  Steroid-induced hyperglycemia - No prior history of DM and A1c not consistent with DM was covered with sliding scale while he was here.    Discharge diagnosis     Active Problems:   Pneumonia due to COVID-19  virus    Discharge instructions    Discharge Instructions    AMB Referral to [facility]   Complete by: As directed    Please assign to [place] Coordinator for complex care and disease management follow up calls and assess for further needs.  Questions please call:   Natividad Brood, RN BSN [facility] Liaison  [phone] business mobile phone Toll free office [phone]  Fax number: [phone] [email or web address]   Reason for consult: [facility] post hospital follow up - [place] of stay   Diagnoses of:  COPD/ Pneumonia Other     Other Diagnosis: COVID - 19   Expected date of contact: 1-3 days (reserved for hospital discharges)   Discharge instructions   Complete by: As directed    Follow with Primary MD Nicoletta Dress, MD in 7 days   Get CBC, CMP, 2 view Chest X ray -  checked next visit within 1 week by Primary MD   Activity: As tolerated with Full fall precautions use walker/cane & assistance as needed  Disposition Home   Diet: Heart Healthy    Special Instructions: If you have smoked or chewed Tobacco  in the last 2 yrs please stop smoking, stop any regular Alcohol  and or any Recreational drug use.  On your next visit with your primary care physician please Get Medicines reviewed and adjusted.  Please request your Prim.MD to go over all Hospital Tests and Procedure/Radiological results at the follow up, please get all Hospital records sent to your Prim MD by signing hospital release before you go home.  If you experience worsening of your admission symptoms, develop shortness of breath, life threatening emergency, suicidal or homicidal thoughts you must seek medical attention immediately by calling 911 or calling your MD immediately  if symptoms less severe.  You Must read complete instructions/literature along with all the possible adverse reactions/side effects for all the  Medicines you take and that have been prescribed to you. Take any new Medicines after you have completely understood and accpet all the possible adverse reactions/side effects.   Increase activity slowly   Complete by: As directed  MyChart COVID-19 home monitoring program   Complete by: Sep 08, 2019    Is the patient willing to use the [place] for home monitoring?: Yes   Temperature monitoring   Complete by: Sep 08, 2019    After how many days would you like to receive a notification of this patient's flowsheet entries?: 1      Discharge Medications   Allergies as of 09/08/2019   No Known Allergies     Medication List    STOP taking these medications   ibuprofen 200 MG tablet Commonly known as: ADVIL   predniSONE 10 MG tablet Commonly known as: DELTASONE     TAKE these medications   acetaminophen 500 MG tablet Commonly known as: TYLENOL Take 500 mg by mouth every 6 (six) hours as needed for mild pain.   albuterol 108 (90 Base) MCG/ACT inhaler Commonly known as: VENTOLIN HFA Inhale 1 puff into the lungs every 6 (six) hours as needed for wheezing or shortness of breath.            Durable Medical Equipment  (From admission, onward)         Start     Ordered   09/07/19 0901  For home use only DME oxygen  Once       Question Answer Comment  Length of Need 6 Months   Mode or (Route) Nasal cannula   Liters per Minute 4   Frequency Continuous (stationary and portable oxygen unit needed)   Oxygen conserving device Yes   Oxygen delivery system Gas      09/07/19 0901   09/06/19 1501  For home use only DME 3 n 1  Once        09/06/19 1500           Follow-up Information    [facility].   Specialty: Home Health Services Contact information: [address] [place] [address] [phone]        [facility] Follow up.   Contact information: [place]  [address] [phone]        Nicoletta Dress, MD. Schedule an appointment as soon as possible for a visit in 1 week(s).   Specialty: Internal Medicine Contact information: [place] [address] [phone]               Major procedures and Radiology Reports - PLEASE review detailed and final reports thoroughly  -       DG Chest 1 View  Result Date: 08/28/2019 CLINICAL DATA:  64 year old male with shortness of breath. EXAM: CHEST  1 VIEW COMPARISON:  Chest radiograph dated 05/19/2019 FINDINGS: Bilateral mid to lower lung field peripheral and subpleural densities most consistent with multifocal pneumonia, likely viral or atypical in etiology including COVID-19. Clinical correlation is recommended. No lobar consolidation, pleural effusion, pneumothorax. Top-normal cardiac silhouette. No acute osseous pathology. IMPRESSION: Multifocal pneumonia. Clinical correlation is recommended. Electronically Signed   By: Anner Crete M.D.   On: 08/28/2019 16:05   DG Chest Port 1 View  Result Date: 09/03/2019 CLINICAL DATA:  Shortness of breath, COVID-19 positivity EXAM: PORTABLE CHEST 1 VIEW COMPARISON:  08/31/2010 FINDINGS: Cardiac shadow is stable. The lungs are well aerated bilaterally. Slight increase in airspace opacity is seen bilaterally without pneumothorax or effusion. No bony abnormality is noted. IMPRESSION: Slight worsening of bilateral airspace disease consistent with the given clinical history. Electronically Signed  By: Inez Catalina M.D.   On: 09/03/2019 06:52   DG Chest Port 1 View  Result Date: 08/31/2019 CLINICAL DATA:  COVID-19 pneumonia EXAM: PORTABLE CHEST 1 VIEW COMPARISON:  08/28/2019 chest radiograph. FINDINGS: Stable cardiomediastinal silhouette with normal heart size. No pneumothorax. No pleural effusion. Extensive patchy opacities throughout both lungs appear slightly worsened. IMPRESSION: Extensive patchy opacities throughout both  lungs appear slightly worsened, compatible with COVID-19 pneumonia. Electronically Signed   By: Ilona Sorrel M.D.   On: 08/31/2019 08:37   VAS [place] LOWER EXTREMITY VENOUS (DVT)  Result Date: 08/29/2019  Lower Venous DVTStudy Indications: Edema.  Comparison Study: no prior Performing Technologist: Abram Sander RVS  Examination Guidelines: A complete evaluation includes B-mode imaging, spectral Doppler, color Doppler, and power Doppler as needed of all accessible portions of each vessel. Bilateral testing is considered an integral part of a complete examination. Limited examinations for reoccurring indications may be performed as noted. The reflux portion of the exam is performed with the patient in reverse Trendelenburg.  +---------+---------------+---------+-----------+----------+--------------+ RIGHT    CompressibilityPhasicitySpontaneityPropertiesThrombus Aging +---------+---------------+---------+-----------+----------+--------------+ CFV      Full           Yes      Yes                                 +---------+---------------+---------+-----------+----------+--------------+ SFJ      Full                                                        +---------+---------------+---------+-----------+----------+--------------+ FV Prox  Full                                                        +---------+---------------+---------+-----------+----------+--------------+ FV Mid   Full                                                        +---------+---------------+---------+-----------+----------+--------------+ FV DistalFull                                                        +---------+---------------+---------+-----------+----------+--------------+ PFV      Full                                                        +---------+---------------+---------+-----------+----------+--------------+ POP      Full           Yes      Yes                                  +---------+---------------+---------+-----------+----------+--------------+  PTV      Full                                                        +---------+---------------+---------+-----------+----------+--------------+ PERO     Full                                                        +---------+---------------+---------+-----------+----------+--------------+   +---------+---------------+---------+-----------+----------+--------------+ LEFT     CompressibilityPhasicitySpontaneityPropertiesThrombus Aging +---------+---------------+---------+-----------+----------+--------------+ CFV      Full           Yes      Yes                                 +---------+---------------+---------+-----------+----------+--------------+ SFJ      Full                                                        +---------+---------------+---------+-----------+----------+--------------+ FV Prox  Full                                                        +---------+---------------+---------+-----------+----------+--------------+ FV Mid   Full                                                        +---------+---------------+---------+-----------+----------+--------------+ FV DistalFull                                                        +---------+---------------+---------+-----------+----------+--------------+ PFV      Full                                                        +---------+---------------+---------+-----------+----------+--------------+ POP      Full           Yes      Yes                                 +---------+---------------+---------+-----------+----------+--------------+ PTV      Full                                                        +---------+---------------+---------+-----------+----------+--------------+  PERO     Full                                                         +---------+---------------+---------+-----------+----------+--------------+     Summary: BILATERAL: - No evidence of deep vein thrombosis seen in the lower extremities, bilaterally. - No evidence of superficial venous thrombosis in the lower extremities, bilaterally. -   *See table(s) above for measurements and observations. Electronically signed by Harold Barban MD on 08/29/2019 at 10:22:53 PM.    Final    ECHOCARDIOGRAM LIMITED  Result Date: 08/29/2019    ECHOCARDIOGRAM LIMITED REPORT   Patient Name:   BRENNYN HAISLEY Date of Exam: 08/29/2019 Medical Rec #:  [id number]     Height:       73.0 in Accession #:    [id number]    Weight:       245.0 lb Date of Birth:  06/06/1955     BSA:          2.345 m Patient Age:    64 years      BP:           132/74 mmHg Patient Gender: M             HR:           73 bpm. Exam Location:  Inpatient Procedure: Limited Echo, Limited Color Doppler and Cardiac Doppler Indications:    Syncope R55  History:        Patient has no prior history of Echocardiogram examinations.                 COVID-19 Positive.  Sonographer:    Mikki Santee RDCS (AE) Referring Phys: [place]  1. Left ventricular ejection fraction, by estimation, is 65 to 70%. The left ventricle has hyperdynamic function. The left ventricle has no regional wall motion abnormalities. Left ventricular diastolic parameters were normal.  2. Right ventricular systolic function is normal. The right ventricular size is normal. Tricuspid regurgitation signal is inadequate for assessing PA pressure.  3. The mitral valve is normal in structure. No evidence of mitral valve regurgitation. No evidence of mitral stenosis.  4. The aortic valve is normal in structure. Aortic valve regurgitation is not visualized. No aortic stenosis is present.  5. The inferior vena cava is dilated in size with <50% respiratory variability, suggesting right atrial pressure of 15 mmHg. FINDINGS  Left Ventricle: Left ventricular  ejection fraction, by estimation, is 65 to 70%. The left ventricle has hyperdynamic function. The left ventricle has no regional wall motion abnormalities. The left ventricular internal cavity size was normal in size. There is no left ventricular hypertrophy. Normal left ventricular filling pressure. Right Ventricle: The right ventricular size is normal. No increase in right ventricular wall thickness. Right ventricular systolic function is normal. Tricuspid regurgitation signal is inadequate for assessing PA pressure. Left Atrium: Left atrial size was normal in size. Right Atrium: Right atrial size was normal in size. Pericardium: There is no evidence of pericardial effusion. Mitral Valve: The mitral valve is normal in structure. Normal mobility of the mitral valve leaflets. No evidence of mitral valve stenosis. Tricuspid Valve: The tricuspid valve is normal in structure. Tricuspid valve regurgitation is not demonstrated. No evidence of tricuspid stenosis. Aortic Valve: The aortic valve is normal in  structure. Aortic valve regurgitation is not visualized. No aortic stenosis is present. Pulmonic Valve: The pulmonic valve was normal in structure. Pulmonic valve regurgitation is not visualized. No evidence of pulmonic stenosis. Aorta: The aortic root is normal in size and structure. Venous: The inferior vena cava is dilated in size with less than 50% respiratory variability, suggesting right atrial pressure of 15 mmHg. IAS/Shunts: No atrial level shunt detected by color flow Doppler. LEFT VENTRICLE PLAX 2D LVIDd:         4.40 cm  Diastology LVIDs:         3.10 cm  LV e' lateral:   11.60 cm/s LV PW:         1.00 cm  LV E/e' lateral: 7.2 LV IVS:        1.00 cm  LV e' medial:    8.70 cm/s LVOT diam:     2.30 cm  LV E/e' medial:  9.6 LVOT Area:     4.15 cm  LEFT ATRIUM             Index LA diam:        3.50 cm 1.49 cm/m LA Vol (A2C):   59.7 ml 25.45 ml/m LA Vol (A4C):   30.8 ml 13.13 ml/m LA Biplane Vol: 44.8 ml 19.10  ml/m   AORTA Ao Root diam: 3.80 cm MITRAL VALVE MV Area (PHT): 3.08 cm    SHUNTS MV Decel Time: 246 msec    Systemic Diam: 2.30 cm MV E velocity: 83.10 cm/s MV A velocity: 65.10 cm/s MV E/A ratio:  1.28 Mihai Croitoru MD Electronically signed by Sanda Klein MD Signature Date/Time: 08/29/2019/3:40:37 PM    Final     Micro Results     No results found for this or any previous visit (from the past 240 hour(s)).  Today   Subjective    Jacob Page today has no headache,no chest abdominal pain,no new weakness tingling or numbness, feels much better wants to go home today.     Objective   Blood pressure 108/71, pulse 73, temperature 98.2 F (36.8 C), temperature source Oral, resp. rate 20, height _0  (1.854 m), weight 111 kg, SpO2 91 %.   Intake/Output Summary (Last 24 hours) at 09/08/2019 1045 Last data filed at 09/08/2019 0857 Gross per 24 hour  Intake 1680 ml  Output 870 ml  Net 810 ml    Exam  Awake Alert, No new F.N deficits, Normal affect [place].AT,PERRAL Supple Neck,No JVD, No cervical lymphadenopathy appriciated.  Symmetrical Chest wall movement, Good air movement bilaterally, CTAB RRR,No Gallops,Rubs or new Murmurs, No Parasternal Heave +ve B.Sounds, Abd Soft, Non tender, No organomegaly appriciated, No rebound -guarding or rigidity. No Cyanosis, Clubbing or edema, No new Rash or bruise   Data Review   CBC w Diff:  Lab Results  Component Value Date   WBC 13.7 (H) 09/08/2019   HGB 17.5 (H) 09/08/2019   HCT 53.6 (H) 09/08/2019   PLT 245 09/08/2019   LYMPHOPCT 10 09/08/2019   MONOPCT 8 09/08/2019   EOSPCT 2 09/08/2019   BASOPCT 0 09/08/2019    CMP:  Lab Results  Component Value Date   NA 140 09/08/2019   K 3.8 09/08/2019   CL 103 09/08/2019   CO2 25 09/08/2019   BUN 27 (H) 09/08/2019   CREATININE 0.96 09/08/2019   PROT 6.2 (L) 09/08/2019   ALBUMIN 3.1 (L) 09/08/2019   BILITOT 2.0 (H) 09/08/2019   ALKPHOS 52 09/08/2019   AST 23 09/08/2019  ALT 61 (H)  09/08/2019  .   Total Time in preparing paper work, data evaluation and todays exam - 46 minutes  Lala Lund M.D on 09/08/2019 at 10:45 AM  [facility]   Office  [phone]

## 2019-09-08 NOTE — Discharge Instructions (Signed)
Follow with Primary MD Paulina Fusi, MD in 7 days   Get CBC, CMP, 2 view Chest X ray -  checked next visit within 1 week by Primary MD   Activity: As tolerated with Full fall precautions use walker/cane & assistance as needed  Disposition Home   Diet: Heart Healthy    Special Instructions: If you have smoked or chewed Tobacco  in the last 2 yrs please stop smoking, stop any regular Alcohol  and or any Recreational drug use.  On your next visit with your primary care physician please Get Medicines reviewed and adjusted.  Please request your Prim.MD to go over all Hospital Tests and Procedure/Radiological results at the follow up, please get all Hospital records sent to your Prim MD by signing hospital release before you go home.  If you experience worsening of your admission symptoms, develop shortness of breath, life threatening emergency, suicidal or homicidal thoughts you must seek medical attention immediately by calling 911 or calling your MD immediately  if symptoms less severe.  You Must read complete instructions/literature along with all the possible adverse reactions/side effects for all the Medicines you take and that have been prescribed to you. Take any new Medicines after you have completely understood and accpet all the possible adverse reactions/side effects.       Person Under Monitoring Name: Jacob Page  Location: [address] [place] [address]   Infection Prevention Recommendations for Individuals Confirmed to have, or Being Evaluated for, 2019 Novel Coronavirus (COVID-19) Infection Who Receive Care at Home  Individuals who are confirmed to have, or are being evaluated for, COVID-19 should follow the prevention steps below until a healthcare provider or local or state health department says they can return to normal activities.  Stay home except to get medical care You should restrict activities outside your home, except for getting medical care.  Do not go to work, school, or public areas, and do not use public transportation or taxis.  Call ahead before visiting your doctor Before your medical appointment, call the healthcare provider and tell them that you have, or are being evaluated for, COVID-19 infection. This will help the healthcare provider's office take steps to keep other people from getting infected. Ask your healthcare provider to call the local or state health department.  Monitor your symptoms Seek prompt medical attention if your illness is worsening (e.g., difficulty breathing). Before going to your medical appointment, call the healthcare provider and tell them that you have, or are being evaluated for, COVID-19 infection. Ask your healthcare provider to call the local or state health department.  Wear a facemask You should wear a facemask that covers your nose and mouth when you are in the same room with other people and when you visit a healthcare provider. People who live with or visit you should also wear a facemask while they are in the same room with you.  Separate yourself from other people in your home As much as possible, you should stay in a different room from other people in your home. Also, you should use a separate bathroom, if available.  Avoid sharing household items You should not share dishes, drinking glasses, cups, eating utensils, towels, bedding, or other items with other people in your home. After using these items, you should wash them thoroughly with soap and water.  Cover your coughs and sneezes Cover your mouth and nose with a tissue when you cough or sneeze, or you can cough or  sneeze into your sleeve. Throw used tissues in a lined trash can, and immediately wash your hands with soap and water for at least 20 seconds or use an alcohol-based hand rub.  Wash your [facility] your hands often and thoroughly with soap and water for at least 20 seconds. You can use an alcohol-based  hand sanitizer if soap and water are not available and if your hands are not visibly dirty. Avoid touching your eyes, nose, and mouth with unwashed hands.   Prevention Steps for Caregivers and Household Members of Individuals Confirmed to have, or Being Evaluated for, COVID-19 Infection Being Cared for in the Home  If you live with, or provide care at home for, a person confirmed to have, or being evaluated for, COVID-19 infection please follow these guidelines to prevent infection:  Follow healthcare provider's instructions Make sure that you understand and can help the patient follow any healthcare provider instructions for all care.  Provide for the patient's basic needs You should help the patient with basic needs in the home and provide support for getting groceries, prescriptions, and other personal needs.  Monitor the patient's symptoms If they are getting sicker, call his or her medical provider and tell them that the patient has, or is being evaluated for, COVID-19 infection. This will help the healthcare provider's office take steps to keep other people from getting infected. Ask the healthcare provider to call the local or state health department.  Limit the number of people who have contact with the patient  If possible, have only one caregiver for the patient.  Other household members should stay in another home or place of residence. If this is not possible, they should stay  in another room, or be separated from the patient as much as possible. Use a separate bathroom, if available.  Restrict visitors who do not have an essential need to be in the home.  Keep older adults, very young children, and other sick people away from the patient Keep older adults, very young children, and those who have compromised immune systems or chronic health conditions away from the patient. This includes people with chronic heart, lung, or kidney conditions, diabetes, and  cancer.  Ensure good ventilation Make sure that shared spaces in the home have good air flow, such as from an air conditioner or an opened window, weather permitting.  Wash your hands often  Wash your hands often and thoroughly with soap and water for at least 20 seconds. You can use an alcohol based hand sanitizer if soap and water are not available and if your hands are not visibly dirty.  Avoid touching your eyes, nose, and mouth with unwashed hands.  Use disposable paper towels to dry your hands. If not available, use dedicated cloth towels and replace them when they become wet.  Wear a facemask and gloves  Wear a disposable facemask at all times in the room and gloves when you touch or have contact with the patient's blood, body fluids, and/or secretions or excretions, such as sweat, saliva, sputum, nasal mucus, vomit, urine, or feces.  Ensure the mask fits over your nose and mouth tightly, and do not touch it during use.  Throw out disposable facemasks and gloves after using them. Do not reuse.  Wash your hands immediately after removing your facemask and gloves.  If your personal clothing becomes contaminated, carefully remove clothing and launder. Wash your hands after handling contaminated clothing.  Place all used disposable facemasks, gloves, and other waste  in a lined container before disposing them with other household waste.  Remove gloves and wash your hands immediately after handling these items.  Do not share dishes, glasses, or other household items with the patient  Avoid sharing household items. You should not share dishes, drinking glasses, cups, eating utensils, towels, bedding, or other items with a patient who is confirmed to have, or being evaluated for, COVID-19 infection.  After the person uses these items, you should wash them thoroughly with soap and water.  Wash laundry thoroughly  Immediately remove and wash clothes or bedding that have blood, body  fluids, and/or secretions or excretions, such as sweat, saliva, sputum, nasal mucus, vomit, urine, or feces, on them.  Wear gloves when handling laundry from the patient.  Read and follow directions on labels of laundry or clothing items and detergent. In general, wash and dry with the warmest temperatures recommended on the label.  Clean all areas the individual has used often  Clean all touchable surfaces, such as counters, tabletops, doorknobs, bathroom fixtures, toilets, phones, keyboards, tablets, and bedside tables, every day. Also, clean any surfaces that may have blood, body fluids, and/or secretions or excretions on them.  Wear gloves when cleaning surfaces the patient has come in contact with.  Use a diluted bleach solution (e.g., dilute bleach with 1 part bleach and 10 parts water) or a household disinfectant with a label that says EPA-registered for coronaviruses. To make a bleach solution at home, add 1 tablespoon of bleach to 1 quart (4 cups) of water. For a larger supply, add  cup of bleach to 1 gallon (16 cups) of water.  Read labels of cleaning products and follow recommendations provided on product labels. Labels contain instructions for safe and effective use of the cleaning product including precautions you should take when applying the product, such as wearing gloves or eye protection and making sure you have good ventilation during use of the product.  Remove gloves and wash hands immediately after cleaning.  Monitor yourself for signs and symptoms of illness Caregivers and household members are considered close contacts, should monitor their health, and will be asked to limit movement outside of the home to the extent possible. Follow the monitoring steps for close contacts listed on the symptom monitoring form.   ? If you have additional questions, contact your local health department or call the epidemiologist on call at [phone] (available 24/7). ? This  guidance is subject to change. For the most up-to-date guidance from [facility], please refer to their website: [email or web address]

## 2019-09-08 NOTE — Progress Notes (Signed)
SATURATION QUALIFICATIONS: (This note is used to comply with regulatory documentation for home oxygen)  Patient Saturations on Room Air at Rest = 82%  Patient Saturations on Room Air while Ambulating = 84%  Patient Saturations on 2 Liters of oxygen while Ambulating = >86%  Please briefly explain why patient needs home oxygen: Pt requiring at least 2L to perform ADLs and functional mobility.   Derrika Ruffalo MSOT, OTR/L Acute Rehab Pager: [phone] Office: [phone]

## 2019-09-08 NOTE — Consult Note (Signed)
   [facility]: [name] Inpatient Consult   09/08/2019  Jacob Page [birth year] [id number]   [facility] [[facility]]  Accountable Care Organization [ACO] Patient:  [employer] plan  Patient evaluated for community based chronic complex disease management services with [facility] Care Management Program as a benefit of patient's [employer]. Spoke with patient via hospital phone,[ COVID-19 precautions] HIPAA verified with patient's name, date of birth and house address verified to explain [facility] Care Management services.   Explained that the patient qualifies for complex disease management as a benefit.  Chart review reveals patient is for home with home health and he consents to additional support for him and his wife, states, "we both have COVID and she could stand some extra support as well."   Plan:  Patient will be assigned to a RN Care Management Coordinator for follow up.  Patient will receive post hospital discharge call and will be evaluated and disease management.  Dr. Foye Deer is the primary care provider noted.    Of note, [facility] Care Management services does not replace or interfere with any services that are arranged by inpatient case management or social work.  For additional questions or referrals please contact:    Charlesetta Shanks, RN BSN CCM [facility]  [phone] business mobile phone Toll free office [phone]  Fax number: [phone] [email or web address]  [email or web address]

## 2019-09-15 ENCOUNTER — Encounter (INDEPENDENT_AMBULATORY_CARE_PROVIDER_SITE_OTHER): Payer: Self-pay

## 2019-09-15 ENCOUNTER — Telehealth: Payer: Self-pay

## 2019-09-15 NOTE — Telephone Encounter (Signed)
Called patient who stated on questionaire that his cough was worse today. Patient stated that he just had visit with his PCP Patient states he is on 02 and has a nebulizer. His PCP has ordered CXR and he is waiting for a ride to get that done. He has not taken any OTC medication for cough per his doctor he has to cough out congestion. Patient was told to continue to follow up with his doctor for an worsening of his condition. He verbalized understanding and will continue with his care plan.

## 2019-09-17 ENCOUNTER — Encounter (INDEPENDENT_AMBULATORY_CARE_PROVIDER_SITE_OTHER): Payer: Self-pay

## 2019-09-17 ENCOUNTER — Telehealth: Payer: Self-pay

## 2019-09-17 NOTE — Telephone Encounter (Signed)
Cough worse today. Worse in am. Bringing up white phlegm. Pt having having SOB with talking that makes cough. Fever last 2 days 100.0. C/o far right chest soreness, feels better now. Took Tylenol and chest soreness no longer issue. Advised pt to drink extra liquids, especially warm fluids,use a humidifier and try cough drops. Advised pt to try 2 tsp of honey at bedtime to help with sleeping. Also informed to avoid cough suppressant that are labeled with the initial DM. Advised pt to call PCP or [facility] if SOB at rest and to call 911 for resp. Distress.  Advised pt to call PCP if sputum is green or yellow or if having high fever despite taking antipyretic. Advised pt that if cough becomes worse despite the use of cough medicine, to call his PCP. Pt verbalized understanding.

## 2019-09-19 ENCOUNTER — Telehealth: Payer: Self-pay | Admitting: *Deleted

## 2019-09-19 NOTE — Telephone Encounter (Signed)
Patient completed MyChart COVID questionnaire.  The completion of the questionnaire with the patient noting his temperature was 101 degrees F at 2 a.m. generated a Multimedia programmer.  This RN contacted patient to discuss his temperature and what he was doing to help control his fever.  Patient stated he was discharged from the hospital on Friday, August 20th. While he was hospitalized with COVID, patient did not have a fever.  The last 3-4 days he has been having low grade fevers with highest reading being 101 degrees F.  Patient awakened this a.m. at 2 a.m. with fever of 101 and coughing.  Patient took two extra strength Tylenol at 2 a.m.  This RN asked patient to check his temperature again.  At 2:50 a.m. patient's  temperature was down to 99.4, which indicates patient is responding to the Tylenol.  This RN asked patient what his PCP had instructed him to do to help control his fever.  Patient stated PCP had not talked about his fever.  He is going to call his PCP to check with him this morning and to get the results of his CXR, which was done yesterday (09/18/2019).  Encouraged patient to stay well hydrated.  Patient stated the color of his urine his pale yellow, as he drinks a lot of fluids.  Patient reported his heart rate is usually 89 unless he has had a breathing treatment and then it goes up to around 111.    This RN asked patient if there was anything else I could do or answer any other questions. The patient replied, "No. Thank you for your call."

## 2019-09-22 ENCOUNTER — Encounter: Payer: Self-pay | Admitting: *Deleted

## 2019-09-22 ENCOUNTER — Other Ambulatory Visit: Payer: Self-pay | Admitting: *Deleted

## 2019-09-22 NOTE — Patient Outreach (Signed)
[facility] Customer service manager [facility]) Care Management [facility] CM Telephone Outreach, Transfer of care from previously assigned [facility] RN CM Post-hospital discharge day # 14   09/22/2019  KORAN SEABROOK 10/02/1955 [id number]  Successful telephone outreach to [name], 64 y/o male referred to this [facility] RN CM 09/20/19 on transfer from previously assigned [facility] RN CM originally referred to [facility] CM 09/08/19 by [facility] RN [facility] liaison after patient experienced recent hospitalization August 9-20, 2021 for pneumonia/ acute respiratory failure with hypoxia/ SOB due to COVID-19 infection in unvaccinated patient.  Patient was discharged home to self-care with home health services through [facility] and while on home covid monitoring program, with newly prescribed home O2.  Patient has history including, but not limited to surgical hernia repair.  HIPAA/ identity verified and purpose of call/ [facility] CM services discussed with patient who provides verbal consent to participation in [facility] CM program/ [facility] CM involvement in his care.    Today, patient reports "doing better slowly" post-recent hospitalization; states that he has a lot of questions about his recuperation and confirms that he has contacted the pulmonary provider this morning in an attempt to schedule a hospital follow up visit as recommended by his PCP 09/15/19; confirms he has the phone number for provider and was told by office staff that he would be contacted next week- encouraged patient to either re-contact provider by mid week, or to contact me if he has not heard back around scheduling- he is agreeable.  Reports ongoing short-lived episodes SOB, primarily with activity/ exertion; reports using home O2 and nebulizer prn now, slowly decreasing the amount of time spent on home O2 with each passing week.  Reports full recovery from episodes SOB "within 5-10 minutes" of applying home O2 and using nebulizer; somewhat discouraged that his recovery is not happening quicker than it  is.  Patient sounds to be in no distress throughout call today.   Reports his wife also had covid and was also hospitalized "right before" he was.  Reports wife also slowly getting better but 'also taking a long time."   . Provided education to patient re: home O2 use; plan of care for COVID recuperation; COVID and Flu vaccinations . Reviewed medications with patient- no concerns identified; patient self-manages medications . Discussed plans with patient for ongoing care management follow up and provided patient with direct contact information for care management team . Advised patient, providing education and rationale, to monitor SaO2 daily prn and record any values < 90% and to contact care providers for findings outside established parameters . Reviewed scheduled/upcoming provider appointments including . SDOH completed: no concerns identified . Coached patient around talking points to cover at time of upcoming pulmonary provider appointment and encouraged patient to write his questions down on paper to take to appointment with him  Patient denies further issues, concerns, or problems today.  I provided/ confirmed that patient has my direct phone number, the main [facility] CM office phone number, and the [facility] CM 24-hour nurse advice phone number should issues arise prior to next scheduled [facility] CM outreach.  Encouraged patient to contact me directly if needs, questions, issues, or concerns arise prior to next scheduled outreach; patient agreed to do so.   Plan:  Patient will take medications as prescribed and will attend all scheduled provider appointments  Patient will promptly notify care providers for any new concerns/ issues/ problems that arise  I will make patient's PCP aware of [facility] RN CM involvement in patient's care-- will send  barriers letter  [facility] CM outreach to continue with scheduled phone call within 3 weeks, sooner if indicated  Caryl Pina, RN, BSN, [employer] Coordinator [facility] Care Management  [phone]

## 2019-10-03 ENCOUNTER — Ambulatory Visit (INDEPENDENT_AMBULATORY_CARE_PROVIDER_SITE_OTHER): Payer: 59 | Admitting: Pulmonary Disease

## 2019-10-03 ENCOUNTER — Encounter: Payer: Self-pay | Admitting: Pulmonary Disease

## 2019-10-03 ENCOUNTER — Other Ambulatory Visit: Payer: Self-pay

## 2019-10-03 VITALS — BP 132/80 | HR 83 | Temp 97.8°F | Ht 73.0 in | Wt 248.4 lb

## 2019-10-03 DIAGNOSIS — J1282 Pneumonia due to coronavirus disease 2019: Secondary | ICD-10-CM | POA: Diagnosis not present

## 2019-10-03 DIAGNOSIS — U071 COVID-19: Secondary | ICD-10-CM

## 2019-10-03 NOTE — Progress Notes (Signed)
Jacob Page    [id number]    Apr 06, 1955  Primary Care Physician:Schultz, Jonetta Speak, MD  Referring Physician: Paulina Fusi, MD [address] [place],  [place] [address]  Chief complaint: Consult for post COVID-45  HPI: 64 year old with obesity, sleep apnea  Hospitalized for COVID-19 in early August.  Treated with Actemra, IV steroids and remdesivir.  Discharged on 3 to 4 L nasal cannula.  He has been himself off oxygen with good O2 sats which he is monitoring at home. Feels that he is making slow recovery He was recently started on prednisone taper starting at 60 mg over 2 weeks by his primary care.  He is in the middle of this taper.  Pets: Cats, outside goats Occupation: Lobbyist Exposures: No known exposures.  No mold, hot tub, Jacuzzi.  No feather pillows or comforters Smoking history: Smoked in high school Travel history: No significant travel history Relevant family history: No significant family history of lung disease  Outpatient Encounter Medications as of 10/03/2019  Medication Sig  . acetaminophen (TYLENOL) 500 MG tablet Take 500 mg by mouth every 6 (six) hours as needed for mild pain.   Marland Kitchen albuterol (VENTOLIN HFA) 108 (90 Base) MCG/ACT inhaler Inhale 1 puff into the lungs every 6 (six) hours as needed for wheezing or shortness of breath.  . predniSONE (DELTASONE) 10 MG tablet Take by mouth.  . Promethazine-Codeine 6.25-10 MG/5ML SOLN Take 5 mLs by mouth every 6 (six) hours as needed. (Patient not taking: Reported on 10/03/2019)   No facility-administered encounter medications on file as of 10/03/2019.    Allergies as of 10/03/2019  . (No Known Allergies)    Past Medical History:  Diagnosis Date  . History of kidney stones    "I had kidney stones around 20Mar 18, 2004, but they passed on their own"  . Pneumonia    "diagnosed maybe 10 years ago"  . Sleep apnea    "did a sleep study where you  stay overnight about 35 years ago in [place], but I don't wear a CPAP because I feel fine"    Past Surgical History:  Procedure Laterality Date  . DENTAL SURGERY  2019   Per pt "I have implants on the upper front teeth, they don't come out. They gave me gas for the procedure"  . EXPLORATORY LAPAROTOMY  04/26/2019  . HEMORROIDECTOMY    . INCISIONAL HERNIA REPAIR  04/26/2019   WITH MESH  . INCISIONAL HERNIA REPAIR N/A 04/26/2019   Procedure: INCISIONAL HERNIA REPAIR WITH MESH (TAR);  Surgeon: Axel Filler, MD;  Location: [facility] OR;  Service: General;  Laterality: N/A;  . INSERTION OF MESH N/A 04/26/2019   Procedure: Insertion Of Mesh;  Surgeon: Axel Filler, MD;  Location: [facility] OR;  Service: General;  Laterality: N/A;  . LAPAROTOMY N/A 04/26/2019   Procedure: EXPLORATORY LAPAROTOMY;  Surgeon: Axel Filler, MD;  Location: [facility] OR;  Service: General;  Laterality: N/A;  . LYSIS OF ADHESION N/A 04/26/2019   Procedure: LYSIS OF ADHESION;  Surgeon: Axel Filler, MD;  Location: [facility] OR;  Service: General;  Laterality: N/A;    History reviewed. No pertinent family history.  Social History   Socioeconomic History  . Marital status: Married    Spouse name: Not on file  . Number of children: Not on file  . Years of education: Not on file  . Highest education level: Not on file  Occupational History  .  Not on file  Tobacco Use  . Smoking status: Former Games developer  . Smokeless tobacco: Never Used  . Tobacco comment: pt quit smoking at age 66  Vaping Use  . Vaping Use: Never used  Substance and Sexual Activity  . Alcohol use: Not Currently  . Drug use: Not Currently    Types: Other-see comments    Comment: CBD oil   . Sexual activity: Not on file  Other Topics Concern  . Not on file  Social History Narrative  . Not on file   Social Determinants of Health   Financial Resource Strain:   . Difficulty of Paying Living Expenses: Not on file  Food Insecurity: No Food Insecurity  .  Worried About Programme researcher, broadcasting/film/video in the Last Year: Never true  . Ran Out of Food in the Last Year: Never true  Transportation Needs: No Transportation Needs  . Lack of Transportation (Medical): No  . Lack of Transportation (Non-Medical): No  Physical Activity:   . Days of Exercise per Week: Not on file  . Minutes of Exercise per Session: Not on file  Stress:   . Feeling of Stress : Not on file  Social Connections:   . Frequency of Communication with Friends and Family: Not on file  . Frequency of Social Gatherings with Friends and Family: Not on file  . Attends Religious Services: Not on file  . Active Member of Clubs or Organizations: Not on file  . Attends Banker Meetings: Not on file  . Marital Status: Not on file  Intimate Partner Violence:   . Fear of Current or Ex-Partner: Not on file  . Emotionally Abused: Not on file  . Physically Abused: Not on file  . Sexually Abused: Not on file    Review of systems: Review of Systems  Constitutional: Negative for fever and chills.  HENT: Negative.   Eyes: Negative for blurred vision.  Respiratory: as per HPI  Cardiovascular: Negative for chest pain and palpitations.  Gastrointestinal: Negative for vomiting, diarrhea, blood per rectum. Genitourinary: Negative for dysuria, urgency, frequency and hematuria.  Musculoskeletal: Negative for myalgias, back pain and joint pain.  Skin: Negative for itching and rash.  Neurological: Negative for dizziness, tremors, focal weakness, seizures and loss of consciousness.  Endo/Heme/Allergies: Negative for environmental allergies.  Psychiatric/Behavioral: Negative for depression, suicidal ideas and hallucinations.  All other systems reviewed and are negative.  Physical Exam: Blood pressure 132/80, pulse 83, temperature 97.8 F (36.6 C), temperature source Oral, height 6\' 1"  (1.854 m), weight 248 lb 6.4 oz (112.7 kg), SpO2 92 %. Gen:      No acute distress HEENT:  EOMI, sclera  anicteric Neck:     No masses; no thyromegaly Lungs:    Clear to auscultation bilaterally; normal respiratory effort CV:         Regular rate and rhythm; no murmurs Abd:      + bowel sounds; soft, non-tender; no palpable masses, no distension Ext:    No edema; adequate peripheral perfusion Skin:      Warm and dry; no rash Neuro: alert and oriented x 3 Psych: normal mood and affect  Data Reviewed: Imaging: Chest x-ray 09/03/2019-bilateral airspace disease. I have reviewed the images personally.  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  Plan/Recommendations: High-res CT, PFTs  Chilton Greathouse MD [facility] 10/03/2019, 10:51 AM  CC: Paulina Fusi, MD

## 2019-10-03 NOTE — Patient Instructions (Signed)
Glad you are recovering from your COVID-19 infection Continue the prednisone taper as prescribed by your primary care doctor We will schedule you for high-resolution CT and PFTs Start an exercise regimen.  From my perspective it is okay to return back to work Follow-up in 3 months.

## 2019-10-09 ENCOUNTER — Other Ambulatory Visit: Payer: Self-pay | Admitting: *Deleted

## 2019-10-09 ENCOUNTER — Encounter: Payer: Self-pay | Admitting: *Deleted

## 2019-10-09 NOTE — Patient Outreach (Signed)
[facility]) Care Management [facility] CM Telephone Outreach Post- hospital discharge day # 31  10/09/2019  TORAN MURCH 10/06/1955 [id number]  Successful telephone outreach to Jacob Page, 64 y/o male referred to this [facility] RN CM 09/20/19 on transfer from previously assigned [facility] RN CM originally referred to [facility] CM 09/08/19 by [facility] RN [facility] liaison after patient experienced recent hospitalization August 9-20, 2021 for pneumonia/ acute respiratory failure with hypoxia/ SOB due to COVID-19 infection in unvaccinated patient.  Patient was discharged home to self-care with home health services through [place] and while on home covid monitoring program, with newly prescribed home O2.  Patient has history including, but not limited to surgical hernia repair.  HIPAA/ identity verified.  Patient reports "doing well overall;" and states that he has started back to work today and is tolerating well.  Reports not having to regularly use home O2/ nebulizer any longer.  Confirmed he attended 10/03/19 pulmonary provider office visit.  Reports SaO2 levels consistently between 91-97% on room air; continues monitoring regularly at home during periods of rest and activity. Reports sleeping better and overall feeling that he is improving clinically.  Patient sounds to be in no distress throughout call today.   -- discussed clinical condition with patient and confirmed that he has no current clinical concerns -- confirmed that patient attended recent pulmonary provider appointment; reviewed with patient today -- confirmed no recent medication changes; patient continues self- managing -- discussed recent use of home O2/ nebulizer -- discussed benefit of exercise and encouraged him to continue as recommended -- discussed timing of covid/ flu vaccines- encouraged patient to discuss with care providers for specific recommendations -- sent PCP [facility] CM initial assessment  Patient denies further issues, concerns,  or problems today. I confirmed that patient hasmy direct phone number, the main [facility] CM office phone number, and the [facility] CM 24-hour nurse advice phone number should issues arise prior to next scheduled TH[facility] CM outreach.  Encouraged patient to contact me directly if needs, questions, issues, or concerns arise prior to next scheduled outreach; patient agreed to do so.  Plan:  Patient will take medications as prescribed and will attend all scheduled provider appointments  Patient will promptly notify care providers for any new concerns/ issues/ problems that arise  I will share [facility] CM notes/ care plan with patient's PCP as [facility] CM initial assessment  THN CM outreach to continue with scheduled phone call next month, sooner if indicated  Caryl Pina, RN, BSN, [employer] Coordinator [facility] Care Management  [phone]

## 2019-10-23 ENCOUNTER — Institutional Professional Consult (permissible substitution): Payer: 59 | Admitting: Pulmonary Disease

## 2019-11-13 ENCOUNTER — Encounter: Payer: Self-pay | Admitting: *Deleted

## 2019-11-13 ENCOUNTER — Other Ambulatory Visit: Payer: Self-pay | Admitting: *Deleted

## 2019-11-13 NOTE — Patient Outreach (Signed)
[name]) Care Management [place] Telephone Outreach  11/13/2019  KRISHAWN Page 1955-05-07 [id number]  Successful incoming telephone outreach from Jacob Page, 64 y/o male referred to this [facility] RN CM 09/20/19 on transfer from previously assigned [place] CM originally referred to [place] 09/08/19 by [name] liaison after patient experienced recent hospitalization August 9-20, 2083fr pneumonia/ acute respiratory failure with hypoxia/ SOB due to COVID-19 infection in unvaccinated patient. Patient was discharged home to self-care with home health services through [place] while on home covid monitoring program, with newly prescribed home O2. Patient has history including, but not limited tosurgical hernia repair.  HIPAA/ identity verified.  Patient reports "has good days/ has bad days;" reports lingering effects of COVID with coughing spells at night and occasionally throughout day.  Recent office visit with PCP 11/03/19; continues using nebulizer at home, approximately 2 times per day on "bad days;" has occasional days when he does not need to use at all.  Continues monitoring SaO2 levels throughout day; reports "94%" consistently, with decreases when he is active- reports recovery time of "about 10 minutes" with rest.  Continues not using home O2- awaiting home oxygenator pick up by [employer]; declines needing assistance to arrange today, will call if necessary.  Reviewed action plan for ongoing residual effects of COVID; patient able to verbalize with minimal prompting; continues to work and transport self to appointments- verbalizes accurate understanding of upcoming scheduled provider appointments.  Patient sounds to be in no distress throughout call today, and denies further issues, concerns, or problems today. Iconfirmed that patient hasmy direct phone number, the [facility] phone [facility], and [facility] nurse advice phone number should issues arise  prior to next scheduled TH[facility] CM outreach. Encouraged patient to contact me directly if needs, questions, issues, or concerns arise prior to next scheduled outreach; patient agreed to do so.  Plan:  Patient will take medications as prescribed and will attend all scheduled provider appointments  Patient will promptly notify care providers for any new concerns/ issues/ problems that arise  THN CM outreach to continue with scheduled phone callnext month, sooner if indicated  Goals Addressed              This Visit's Progress   .  Make and Keep All Appointments        Follow Up Date 12/13/19   - keep a calendar with appointment dates    Why is this important?   Part of staying healthy is seeing the doctor for follow-up care.  If you forget your appointments, there are some things you can do to stay on track.    Notes:   11/13/19: -- confirmed patient continues transporting self to provider appointments; reviewed all recent and upcoming provider appointments with patient    .  Matintain My Quality of Life        Follow Up Date 12/13/2019   - discuss my treatment options with the doctor or nurse - make shared treatment decisions with doctor    Why is this important?   Having a long-term illness can be scary.  It can also be stressful for you and your caregiver.  These steps may help.    Notes:   11/13/19: -- reviewed recent and upcoming provider appointments with patient; encouraged patient to continue writing down questions for care providers to take with him to provider appointments    .  Protect My Health  Follow Up Date 12/13/19   - schedule appointment for flu shot - schedule appointment for vaccines needed due to my age or health - schedule recommended health tests (blood work, mammogram, colonoscopy, pap test)    Why is this important?   Screening tests can find diseases early when they are easier to treat.  Your doctor or nurse will talk with you  about which tests are important for you.  Getting shots for common diseases like the flu and shingles will help prevent them.     Notes:   11/13/2019: -- confirmed patient has spoken to care provider team regarding timing of flu and covid vaccines, post- covid pneumonia    .  COMPLETED: THN CM: "I want to get over covid and get back to my life" (pt-stated)   On track     [name] (see longitudinal plan of care for additional care plan information)  Current Barriers:  . Care Coordination needs related to ongoing recuperation at home from recent COVID-19 infection requiring hospitalization- no other chronic conditions noted . Chronic Disease Management support and education needs related to post- COVID 19 recovery  Nurse Case Manager Clinical Goal(s):  Marland Kitchen Over the next 60 days, patient will verbalize understanding of plan for ongoing pulmonary provider follow up post recent hospitalization 10/09/19: on track; goal maintained . Over the next 30 days, patient will not experience hospital admission. Hospital Admissions in last 6 months = 2--- one of which was elective hernia repair  10/09/19: goal met  Interventions:  . Inter-disciplinary care team collaboration (see longitudinal plan of care) . Evaluation of current treatment plan related to post- COVID 19 plan of care and patient's adherence to plan as established by provider. . Advised patient to maintain contact with established care providers and to follow up next week with pulmonary provider on scheduling appointment if he has not yet heard from provider office after today's call . Provided education to patient re: home O2 use; plan of care for COVID recuperation; COVID and Flu vaccinations . Reviewed medications with patient- no concerns identified; patient self-manages medications . Discussed plans with patient for ongoing care management follow up and provided patient with direct contact information for care management  team . Advised patient, providing education and rationale, to monitor SaO2 daily prn and record any values < 90% and to contact care providers for findings outside established parameters . Reviewed scheduled/upcoming provider appointments including . SDOH completed: no concerns identified . Coached patient around talking points to cover at time of upcoming pulmonary provider appointment and encouraged patient to write his questions down on paper to take to appointment with him  10/09/19: -- discussed clinical condition with patient and confirmed that he has no current clinical concerns -- confirmed that patient attended recent pulmonary provider appointment; reviewed with patient today -- confirmed no recent medication changes; patient continues self- managing -- discussed recent use of home O2/ nebulizer -- discussed benefit of exercise and encouraged him to continue as recommended -- discussed timing of covid/ flu vaccines- encouraged patient to discuss with care providers for specific recommendations -- sent PCP [facility] CM initial assessment  Patient Self Care Activities:  . Patient verbalizes understanding of plan to continue plan of care as currently established . Self administers medications as prescribed . Attends all scheduled provider appointments . Calls pharmacy for medication refills . Performs ADL's independently . Performs IADL's independently . Calls provider office for new concerns or questions . No self-care deficits identified  Initial  goal documentation 09/22/19  10/09/19: updated  11/13/19: Goal resolved due to duplication  I appreciate the opportunity to participate in Madyx's care,  Oneta Rack, RN, BSN, [place] Coordinator [facility] Care Management  [phone]        .  THN CM: Learn More About My Health        Follow Up Date 12/13/2019    - tell my story and reason for my visit - make a list of questions - ask questions -  repeat what I heard to make sure I understand - bring a list of my medicines to the visit - speak up when I don't understand    Why is this important?   The best way to learn about your health and care is by talking to the doctor and nurse.  They will answer your questions and give you information in the way that you like best.    Notes:      Oneta Rack, RN, BSN, [place] Coordinator [facility] Care Management  [phone]

## 2019-11-13 NOTE — Patient Outreach (Signed)
[facility] Customer service manager [facility]) Care Management [facility] CM Telephone Outreach Unsuccessful outreach attempt # 1- previously engaged patient   11/13/2019  KAYLIN SCHELLENBERG 1955/10/09 [id number]  Unsuccessful telephone outreach to [name], 64 y/o male referred to this [facility] RN CM 09/20/19 on transfer from previously assigned [facility] RN CM originally referred to [facility] CM 09/08/19 by [facility] RN [facility] liaison after patient experienced recent hospitalization August 9-20, 2019for pneumonia/ acute respiratory failure with hypoxia/ SOB due to COVID-19 infection in unvaccinated patient. Patient was discharged home to self-care with home health services through [place] and while on home covid monitoring program, with newly prescribed home O2. Patient has history including, but not limited tosurgical hernia repair.  HIPAA compliant voice mail message left for patient, requesting return call back.  Plan:  Will re-attempt THN CM telephone outreach within 4 business days if I do not hear back from patient first  Caryl Pina, RN, BSN, [employer] Coordinator [facility] Care Management  [phone]

## 2019-11-16 ENCOUNTER — Other Ambulatory Visit: Payer: Self-pay

## 2019-11-16 ENCOUNTER — Ambulatory Visit (INDEPENDENT_AMBULATORY_CARE_PROVIDER_SITE_OTHER)
Admission: RE | Admit: 2019-11-16 | Discharge: 2019-11-16 | Disposition: A | Discharge status: Home / Self Care | Payer: 59 | Source: Ambulatory Visit | Attending: Pulmonary Disease | Admitting: Pulmonary Disease

## 2019-11-16 DIAGNOSIS — J1282 Pneumonia due to coronavirus disease 2019: Secondary | ICD-10-CM | POA: Diagnosis not present

## 2019-11-16 DIAGNOSIS — U071 COVID-19: Secondary | ICD-10-CM

## 2019-11-17 ENCOUNTER — Ambulatory Visit: Payer: Self-pay | Admitting: *Deleted

## 2019-12-04 ENCOUNTER — Ambulatory Visit: Payer: 59

## 2019-12-13 ENCOUNTER — Other Ambulatory Visit: Payer: Self-pay | Admitting: *Deleted

## 2019-12-13 ENCOUNTER — Encounter: Payer: Self-pay | Admitting: *Deleted

## 2019-12-13 NOTE — Patient Outreach (Signed)
[facility] Customer service manager [facility]) Care Management [facility] CM Telephone Outreach Unsuccessful Outreach attempt # 1- previously engaged patient  12/13/2019  LYNDELL ALLAIRE August 29, 1955 [id number]  Unsuccessful telephone outreach to [name], 64 y/o male referred to this [facility] RN CM 09/20/19 on transfer from previously assigned [facility] RN CM originally referred to [facility] CM 09/08/19 by [facility] RN [facility] liaison after patient experienced recent hospitalization August 9-20, 2016for pneumonia/ acute respiratory failure with hypoxia/ SOB due to COVID-19 infection in unvaccinated patient. Patient was discharged home to self-care with home health services through [place] and while on home covid monitoring program, with newly prescribed home O2. Patient has history including, but not limited tosurgical hernia repair.  HIPAA compliant voice mail message left for patient, requesting return call back.  Plan:  Will re-attempt THN CM telephone outreach within 4 business days if I do not hear back from patient first  Caryl Pina, RN, BSN, [employer] Coordinator [facility] Care Management  [phone]

## 2019-12-20 ENCOUNTER — Other Ambulatory Visit: Payer: Self-pay | Admitting: *Deleted

## 2019-12-20 ENCOUNTER — Encounter: Payer: Self-pay | Admitting: *Deleted

## 2019-12-20 NOTE — Patient Outreach (Signed)
[facility] Customer service manager [facility]) Care Management [facility] CM Telephone Outreach Unsuccessful (consecutive) second outreach attempt- previously engaged patient  12/20/2019  Jacob Page Apr 15, 1955 [id number]  Unsuccessful (consecutive) second telephone outreach attempt to [name], 64 y/o male referred to this [facility] RN CM 09/20/19 on transfer from previously assigned [facility] RN CM, originally referred to [facility] CM 09/08/19 by [facility] RN [facility] liaison after patient experienced recent hospitalization August 9-20, 2020for pneumonia/ acute respiratory failure with hypoxia/ SOB due to COVID-19 infection in unvaccinated patient. Patient was discharged home to self-care with home health services through [place] and while on home covid monitoring program, with newly prescribed home O2. Patient has history including, but not limited tosurgical hernia repair.  HIPAA compliant voice mail message left for patient, requesting return call back.  Plan:  Will place [facility] CM unsuccessful patient outreach letter in mail requesting call back in writing, as this is the second consecutive unsuccessful outreach attempt in previously engaged patient  Will re-attempt THN CM telephone outreach within 4 business days if I do not hear back from patient first  Caryl Pina, RN, BSN, [employer] Coordinator [facility] Care Management  [phone]

## 2019-12-25 ENCOUNTER — Encounter: Payer: Self-pay | Admitting: *Deleted

## 2019-12-25 ENCOUNTER — Other Ambulatory Visit: Payer: Self-pay | Admitting: *Deleted

## 2019-12-25 NOTE — Patient Outreach (Signed)
[name]) Care Management [facility] CM Telephone Outreach, Case closure- goals met  12/25/2019  HART HAAS July 09, 1955 [id number]  Successful telephone outreach to Jacob Page, 64 y/o male referred to this [facility] RN CM 09/20/19 on transfer from previously assigned [place] CM originally referred to [place] 09/08/19 by [facility] liaison after patient experienced recent hospitalization August 9-20, 2065fr pneumonia/ acute respiratory failure with hypoxia/ SOB due to COVID-19 infection in unvaccinated patient. Patient was discharged home to self-care with [place] while on home covid monitoring program, with newly prescribed home O2; both home health services and home O2 have now been discontinued. Patient has history including, but not limited tosurgical hernia repair.  HIPAA/ identity verified. Patient reports "overall doing well;" reports no longer having to take any medications; has continued working at his full time job, drives self for provider appointments, errands/ work.  Verbalizes plans to attend upcoming pulmonary provider office visit for PFT's.  Patient verbalizes no ongoing care coordination/ disease management/ pharmacy/ community resource needs, and confirms that he believes he has met his [facility] and is ready for case closure.  Patient sounds to be in no distress throughout call today, and denies further issues, concerns, or problems today. Iconfirmed that patient hasmy direct phone number, the main [facility] CM office phone number, and the [facility] 24-hour nurse advice phone number should issues arise in the future and he wish to resume [place].  Plan:  [facility], as patient verbalizes no ongoing care coordination/ disease management/ pharmacy/ community resource needs and has successfully met his previously established [facility]; will make patient's PCP aware of  same.  It has been a pleasure caring for AJesse Fall RN, BSN, CErie Insurance GroupCoordinator [facility] Management  [phone]

## 2019-12-29 ENCOUNTER — Other Ambulatory Visit (HOSPITAL_COMMUNITY)
Admission: RE | Admit: 2019-12-29 | Discharge: 2019-12-29 | Disposition: A | Discharge status: Home / Self Care | Payer: 59 | Source: Ambulatory Visit | Attending: Adult Health | Admitting: Adult Health

## 2019-12-29 DIAGNOSIS — Z20822 Contact with and (suspected) exposure to covid-19: Secondary | ICD-10-CM | POA: Insufficient documentation

## 2019-12-29 DIAGNOSIS — Z01812 Encounter for preprocedural laboratory examination: Secondary | ICD-10-CM | POA: Diagnosis present

## 2019-12-29 LAB — SARS CORONAVIRUS 2 (TAT 6-24 HRS): SARS Coronavirus 2: NEGATIVE

## 2020-01-01 ENCOUNTER — Ambulatory Visit (INDEPENDENT_AMBULATORY_CARE_PROVIDER_SITE_OTHER): Payer: 59 | Admitting: Pulmonary Disease

## 2020-01-01 ENCOUNTER — Ambulatory Visit: Payer: 59 | Admitting: Adult Health

## 2020-01-01 ENCOUNTER — Encounter: Payer: Self-pay | Admitting: Acute Care

## 2020-01-01 ENCOUNTER — Other Ambulatory Visit: Payer: Self-pay

## 2020-01-01 ENCOUNTER — Ambulatory Visit (INDEPENDENT_AMBULATORY_CARE_PROVIDER_SITE_OTHER): Payer: 59 | Admitting: Acute Care

## 2020-01-01 VITALS — BP 124/74 | HR 71 | Temp 97.2°F | Ht 76.0 in | Wt 260.0 lb

## 2020-01-01 DIAGNOSIS — U099 Post covid-19 condition, unspecified: Secondary | ICD-10-CM | POA: Diagnosis not present

## 2020-01-01 DIAGNOSIS — R0609 Other forms of dyspnea: Secondary | ICD-10-CM

## 2020-01-01 DIAGNOSIS — J1282 Pneumonia due to coronavirus disease 2019: Secondary | ICD-10-CM

## 2020-01-01 DIAGNOSIS — U071 COVID-19: Secondary | ICD-10-CM

## 2020-01-01 LAB — PULMONARY FUNCTION TEST
DL/VA % pred: 134 %
DL/VA: 5.51 ml/min/mmHg/L
DLCO cor % pred: 89 %
DLCO cor: 27.29 ml/min/mmHg
DLCO unc % pred: 89 %
DLCO unc: 27.29 ml/min/mmHg
FEF 25-75 Post: 4.48 L/sec
FEF 25-75 Pre: 4.13 L/sec
FEF2575-%Change-Post: 8 %
FEF2575-%Pred-Post: 141 %
FEF2575-%Pred-Pre: 130 %
FEV1-%Change-Post: 4 %
FEV1-%Pred-Post: 78 %
FEV1-%Pred-Pre: 75 %
FEV1-Post: 3.14 L
FEV1-Pre: 3 L
FEV1FVC-%Change-Post: 1 %
FEV1FVC-%Pred-Pre: 114 %
FEV6-%Change-Post: 2 %
FEV6-%Pred-Post: 70 %
FEV6-%Pred-Pre: 68 %
FEV6-Post: 3.59 L
FEV6-Pre: 3.49 L
FEV6FVC-%Pred-Post: 104 %
FEV6FVC-%Pred-Pre: 104 %
FVC-%Change-Post: 3 %
FVC-%Pred-Post: 67 %
FVC-%Pred-Pre: 65 %
FVC-Post: 3.61 L
FVC-Pre: 3.49 L
Post FEV1/FVC ratio: 87 %
Post FEV6/FVC ratio: 100 %
Pre FEV1/FVC ratio: 86 %
Pre FEV6/FVC Ratio: 100 %
RV % pred: 82 %
RV: 2.11 L
TLC % pred: 78 %
TLC: 6.1 L

## 2020-01-01 NOTE — Progress Notes (Signed)
Full PFT performed today. °

## 2020-01-01 NOTE — Patient Instructions (Addendum)
It is good to see you today. It has been 4 months since you were diagnosed with Covid. You can get the Covid Vaccine at this point. I would encourage you to do this.  Your Pulmonary Function Tests show some restriction and decreased lung volumes. Work on losing weight to get back to your pre covid baseline weight. Remember to work on taking deep breaths when you are resting  It is safe for you to travel for work. Travel with your rescue inhaler I will encourage you to use your rescue inhaler as needed for shortness of breath or wheezing. Follow up with Dr. Isaiah Serge in 3 months to make sure you are continuing to improve.  Please contact office for sooner follow up if symptoms do not improve or worsen or seek emergency care

## 2020-01-01 NOTE — Progress Notes (Signed)
History of Present Illness Jacob Page is a 64 y.o. male former smoker with obesity and sleep apnea. He had Covid 19 08/2019. He is followed by Dr. Isaiah Serge for post Covid dyspnea and deconditioning.  Synopsis Hospitalized for COVID-19 in early August.  Treated with Actemra, IV steroids and remdesivir.  Discharged on 3 to 4 L nasal cannula.  He has weaned  himself off oxygen with good O2 sats which he continued to  monitor at home. Feels that he is making slow recovery. He has completed a steroid taper and has not required rescue inhaler x 3-4 weeks.  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 Exposures: No known exposures.  No mold, hot tub, Jacuzzi.  No feather pillows or comforters Smoking history: Smoked in high school Travel history: No significant travel history Relevant family history: No significant family history of lung disease   01/01/2020  Pt. Presents for follow up of post Covid deconditioning and exertional dyspnea. He states he has been doing better in regards to his breathing status, slow improvement but still has occasions periods when he is resting and he needs to take a deep breath. He does have some exertional shortness of breath. He states this is better than it was but it is not back to baseline. He has stopped his prednisone as the prescription has run out, and he states he stopped using his albuterol inhaler 3-4 weeks ago. He states he has gained weight , about 10 pounds since the end of August due to Covid. This has been due to his inability to exercise and decreased activity due to his respiratory illness.He states his fatigue is slowly improving.He states he does occasionally have some wheezing which is also improving.He has deferred on offer of pulmonary rehab.  He has some concerns about taking the vaccine.We discussed these. I have encouraged him to take the vaccine. He is beyond the 90 day post virus  window, and should be vaccinated. Per his PFT's no obstruction, some restriction that is most likely due to weight , BMI is 31.65 ( Obese). DLCO is normal, TLC is decreased. Last HRCT 11/16/2019 shows scarring in the lung that is likely from  recent Covid infection. Dr. Isaiah Serge  expects this to get slowly better over time, as we are seeing clinical improvement.     Test Results: Covid + 08/28/2019  HRCT 11/16/2019 There is moderate pulmonary fibrosis without clear apical to basal gradient, featuring extensive peripheral and peribronchovascular irregular interstitial opacity, ground-glass, and septal thickening, with mild tubular bronchiectasis throughout. Lobular to segmental air trapping on expiratory phase imaging. These may reflect bland post infectious or inflammatory fibrosis, however if characterized by ATS pulmonary fibrosis criteria findings are in an "alternative diagnosis" pattern with differential considerations of chronic fibrosing hypersensitivity pneumonitis and NSIP. 2. Diffuse bilateral bronchial wall thickening, consistent with nonspecific infectious or inflammatory bronchitis. 3. Coronary artery disease.   PFT's          CBC Latest Ref Rng & Units 09/08/2019 09/07/2019 09/06/2019  WBC 4.0 - 10.5 K/uL 13.7(H) 15.6(H) 18.2(H)  Hemoglobin 13.0 - 17.0 g/dL 17.5(H) 17.2(H) 17.0  Hematocrit 39.0 - 52.0 % 53.6(H) 52.6(H) 51.2  Platelets 150 - 400 K/uL 245 253 298    BMP Latest Ref Rng & Units 09/08/2019 09/07/2019 09/06/2019  Glucose 70 - 99 mg/dL 284(X) 324(M) 010(U)  BUN 8 - 23 mg/dL 72(Z) 36(U) 44(I)  Creatinine 0.61 - 1.24 mg/dL 3.47 4.25 9.56  Sodium 135 -  145 mmol/L 140 140 139  Potassium 3.5 - 5.1 mmol/L 3.8 4.3 3.9  Chloride 98 - 111 mmol/L 103 103 103  CO2 22 - 32 mmol/L 25 28 24   Calcium 8.9 - 10.3 mg/dL ) 2.5(K) 5.3(Z)    BNP    Component Value Date/Time   BNP 36.2 09/06/2019 0340    ProBNP No results found for: PROBNP  PFT     Component Value Date/Time   FEV1PRE 3.00 01/01/2020 0854   FEV1POST 3.14 01/01/2020 0854   FVCPRE 3.49 01/01/2020 0854   FVCPOST 3.61 01/01/2020 0854   TLC 6.10 01/01/2020 0854   DLCOUNC 27.29 01/01/2020 0854   PREFEV1FVCRT 86 01/01/2020 0854   PSTFEV1FVCRT 87 01/01/2020 0854    No results found.   Past medical hx Past Medical History:  Diagnosis Date   History of kidney stones    "I had kidney stones around 20Apr 06, 2004, but they passed on their own"   Pneumonia    "diagnosed maybe 10 years ago"   Sleep apnea    "did a sleep study where you stay overnight about 35 years ago in [place], but I don't wear a CPAP because I feel fine"     Social History   Tobacco Use   Smoking status: Former Smoker   Smokeless tobacco: Never Used   Tobacco comment: pt quit smoking at age 89  Vaping Use   Vaping Use: Never used  Substance Use Topics   Alcohol use: Not Currently   Drug use: Not Currently    Types: Other-see comments    Comment: CBD oil     Mr.Bolding reports that he has quit smoking. He has never used smokeless tobacco. He reports previous alcohol use. He reports previous drug use. Drug: Other-see comments.  Tobacco Cessation: Counseling given: Yes Comment: pt quit smoking at age 83 Not enough information to calculate  a pack year smoking hostory  Past surgical hx, Family hx, Social hx all reviewed.  No current outpatient medications on file prior to visit.   No current facility-administered medications on file prior to visit.     No Known Allergies  Review Of Systems:  Constitutional:   No  weight loss, night sweats,  Fevers, chills, + fatigue, or  lassitude.  HEENT:   No headaches,  Difficulty swallowing,  Tooth/dental problems, or  Sore throat,                No sneezing, itching, ear ache, nasal congestion, post nasal drip,   CV:  No chest pain,  Orthopnea, PND, swelling in lower extremities, anasarca, dizziness, palpitations, syncope.   GI  No  heartburn, indigestion, abdominal pain, nausea, vomiting, diarrhea, change in bowel habits, loss of appetite, bloody stools.   Resp: + shortness of breath with exertion not  at rest.This is improving.  No excess mucus, no productive cough,  No non-productive cough,  No coughing up of blood.  No change in color of mucus.  No wheezing.  No chest wall deformity  Skin: no rash or lesions.  GU: no dysuria, change in color of urine, no urgency or frequency.  No flank pain, no hematuria   MS:  No joint pain or swelling.  No decreased range of motion.  No back pain.  Psych:  No change in mood or affect. No depression or anxiety.  No memory loss.   Vital Signs BP 124/74 (BP Location: Left Arm, Cuff Size: Normal)    Pulse 71    Temp (!) 97.2 F (  36.2 C) (Oral)    Ht 6\' 4"  (1.93 m)    Wt 260 lb (117.9 kg)    SpO2 96%    BMI 31.65 kg/m    Physical Exam:  General- No distress,  A&Ox3 ENT: No sinus tenderness, TM clear, pale nasal mucosa, no oral exudate,no post nasal drip, no LAN Cardiac: S1, S2, regular rate and rhythm, no murmur Chest: No wheeze/ rales/ dullness; no accessory muscle use, no nasal flaring, no sternal retractions Abd.: Soft Non-tender, ND, BS +, Body mass index is 31.65 kg/m. Ext: No clubbing cyanosis, edema Neuro:  normal strength, but physical deconditioning, not at pre covid baseline Skin: No rashes, warm and dry Psych: normal mood and behavior   Assessment/Plan  Post Covid 19 Dyspnea Plan It has been 4 months since you were diagnosed with Covid. You can get the Covid Vaccine at this point. I would encourage you to do this.  Your Pulmonary Function Tests show some restriction and decreased lung volumes. Work on losing weight to get back to your pre covid baseline weight. Remember to work on taking deep breaths when you are resting  It is safe for you to travel for work. Travel with your rescue inhaler I will encourage you to use your rescue inhaler as needed for  shortness of breath or wheezing. Follow up with Dr. in 3 months to make sure you are continuing to improve.  Call Isaiah Serge if you need [place] sooner Please contact office for sooner follow up if symptoms do not improve or worsen or seek emergency care    [place], NP 01/01/2020  10:14 AM

## 2020-04-01 ENCOUNTER — Ambulatory Visit: Payer: 59 | Admitting: Pulmonary Disease

## 2021-05-06 ENCOUNTER — Emergency Department (HOSPITAL_COMMUNITY): Payer: Medicare Other

## 2021-05-06 ENCOUNTER — Emergency Department (HOSPITAL_COMMUNITY)
Admission: EM | Admit: 2021-05-06 | Discharge: 2021-05-06 | Disposition: A | Discharge status: Home / Self Care | Payer: Medicare Other | Attending: Emergency Medicine | Admitting: Emergency Medicine

## 2021-05-06 ENCOUNTER — Other Ambulatory Visit: Payer: Self-pay

## 2021-05-06 DIAGNOSIS — Z28310 Unvaccinated for covid-19: Secondary | ICD-10-CM | POA: Diagnosis not present

## 2021-05-06 DIAGNOSIS — U071 COVID-19: Secondary | ICD-10-CM | POA: Diagnosis not present

## 2021-05-06 DIAGNOSIS — J45909 Unspecified asthma, uncomplicated: Secondary | ICD-10-CM | POA: Insufficient documentation

## 2021-05-06 DIAGNOSIS — R059 Cough, unspecified: Secondary | ICD-10-CM | POA: Diagnosis present

## 2021-05-06 LAB — COMPREHENSIVE METABOLIC PANEL
ALT: 23 U/L (ref 0–44)
AST: 20 U/L (ref 15–41)
Albumin: 3.5 g/dL (ref 3.5–5.0)
Alkaline Phosphatase: 58 U/L (ref 38–126)
Anion gap: 7 (ref 5–15)
BUN: 11 mg/dL (ref 8–23)
CO2: 22 mmol/L (ref 22–32)
Calcium: 8.3 mg/dL — ABNORMAL LOW (ref 8.9–10.3)
Chloride: 109 mmol/L (ref 98–111)
Creatinine, Ser: 0.82 mg/dL (ref 0.61–1.24)
GFR, Estimated: >60 mL/min (ref >60)
Glucose, Bld: 123 mg/dL — ABNORMAL HIGH (ref 70–99)
Potassium: 3.6 mmol/L (ref 3.5–5.1)
Sodium: 138 mmol/L (ref 135–145)
Total Bilirubin: 1.8 mg/dL — ABNORMAL HIGH (ref 0.3–1.2)
Total Protein: 6.4 g/dL — ABNORMAL LOW (ref 6.5–8.1)

## 2021-05-06 LAB — CBC WITH DIFFERENTIAL/PLATELET
Abs Immature Granulocytes: 0.08 10*3/uL — ABNORMAL HIGH (ref 0.00–0.07)
Basophils Absolute: 0.1 10*3/uL (ref 0.0–0.1)
Basophils Relative: 1 %
Eosinophils Absolute: 0.4 10*3/uL (ref 0.0–0.5)
Eosinophils Relative: 4 %
HCT: 44.8 % (ref 39.0–52.0)
Hemoglobin: 14.9 g/dL (ref 13.0–17.0)
Immature Granulocytes: 1 %
Lymphocytes Relative: 7 %
Lymphs Abs: 0.7 10*3/uL (ref 0.7–4.0)
MCH: 28.9 pg (ref 26.0–34.0)
MCHC: 33.3 g/dL (ref 30.0–36.0)
MCV: 86.8 fL (ref 80.0–100.0)
Monocytes Absolute: 0.9 10*3/uL (ref 0.1–1.0)
Monocytes Relative: 8 %
Neutro Abs: 8.5 10*3/uL — ABNORMAL HIGH (ref 1.7–7.7)
Neutrophils Relative %: 79 %
Platelets: 209 10*3/uL (ref 150–400)
RBC: 5.16 MIL/uL (ref 4.22–5.81)
RDW: 13.8 % (ref 11.5–15.5)
WBC: 10.6 10*3/uL — ABNORMAL HIGH (ref 4.0–10.5)
nRBC: 0 % (ref 0.0–0.2)

## 2021-05-06 LAB — URINALYSIS, ROUTINE W REFLEX MICROSCOPIC
Bacteria, UA: NONE SEEN
Bilirubin Urine: NEGATIVE
Glucose, UA: NEGATIVE mg/dL
Ketones, ur: NEGATIVE mg/dL
Leukocytes,Ua: NEGATIVE
Nitrite: NEGATIVE
Protein, ur: NEGATIVE mg/dL
Specific Gravity, Urine: 1.025 (ref 1.005–1.030)
pH: 5 (ref 5.0–8.0)

## 2021-05-06 LAB — LACTIC ACID, PLASMA: Lactic Acid, Venous: 1.5 mmol/L (ref 0.5–1.9)

## 2021-05-06 LAB — RESP PANEL BY RT-PCR (FLU A&B, COVID) ARPGX2
Influenza A by PCR: NEGATIVE
Influenza B by PCR: NEGATIVE
SARS Coronavirus 2 by RT PCR: POSITIVE — AB

## 2021-05-06 MED ORDER — MOLNUPIRAVIR EUA 200MG CAPSULE: 4.0000 | ORAL_CAPSULE | Freq: Two times a day (BID) | ORAL | 0 refills | Status: AC

## 2021-05-06 MED ORDER — ALBUTEROL SULFATE HFA 108 (90 BASE) MCG/ACT IN AERS
2.0000 | INHALATION_SPRAY | RESPIRATORY_TRACT | 1 refills | Status: DC | PRN
Start: 2021-05-06 — End: 2023-01-15

## 2021-05-06 MED ORDER — ACETAMINOPHEN 500 MG PO TABS
1000.0000 mg | ORAL_TABLET | Freq: Once | ORAL | Status: AC
Start: 2021-05-06 — End: 2021-05-06
  Administered 2021-05-06: 1000 mg via ORAL
  Filled 2021-05-06: qty 2

## 2021-05-06 NOTE — ED Provider Triage Note (Signed)
Emergency Medicine Provider Triage Evaluation Note ? ?Jacob Page , a 66 y.o. male  was evaluated in triage.  Pt complains of congestion, rhinorrhea, shortness of breath and chest pain.  The symptoms began 2 days ago.  This morning he tested positive for COVID.  Last time in 2021 when he tested positive he was admitted to the hospital ? ?Review of Systems  ?Positive: Chest pain, shortness of breath, fevers and chills ?Negative: Nausea, vomiting, abdominal pain, diarrhea ? ?Physical Exam  ?BP 96/65 (BP Location: Right Arm)   Pulse 87   Temp (!) 100.8 ?F (38.2 ?C) (Oral)   Resp 20   Ht 6\' 1"  (1.854 m)   Wt 127 kg   SpO2 95%   BMI 36.94 kg/m?  ?Gen:   Awake, no distress   ?Resp:  Mildly increased work of breathing ?MSK:   Moves extremities without difficulty  ?Other:  Left sided wheezes and rhonchi.  Clear sounds on right side ? ?Medical Decision Making  ?Medically screening exam initiated at 11:29 AM.  Appropriate orders placed.  . was informed that the remainder of the evaluation will be completed by another provider, this initial triage assessment does not replace that evaluation, and the importance of remaining in the ED until their evaluation is complete. ? ? ?Tylenol ordered for fever ?  ?Jacob Glenn, PA-C ?05/06/21 1130 ? ?

## 2021-05-06 NOTE — ED Provider Notes (Signed)
?[facility] ?Provider Note ? ? ?CSN: [id number] ?Arrival date & time: 05/06/21  1042 ? ?  ? ?History ? ?Chief Complaint  ?Patient presents with  ? Cough  ? Shortness of Breath  ? Fever  ? Generalized Body Aches  ? Chills  ? ? ?Jacob Glennlton V Lorentz Jr. is a 66 y.o. male. ? ?Pt with non prod cough, congestion, runny nose, body aches, and fever in past 2-3 days. Symptoms acute onset, moderate, persistent. Indicates had covid 08/2019, similar, but not as bad this time. Was not vaccinated for covid. Denies chest pain or discomfort. No sob. No abd pain or nvd. +normal appetite, is eating/drinking. Non smoker. Remote hx asthma, uses mdi prn. No leg pain or swelling.  ? ?The history is provided by the patient and medical records.  ?Cough ?Associated symptoms: fever, myalgias, rhinorrhea and shortness of breath   ?Associated symptoms: no chest pain, no headaches and no rash   ?Shortness of Breath ?Associated symptoms: cough and fever   ?Associated symptoms: no abdominal pain, no chest pain, no headaches, no neck pain, no rash and no vomiting   ?Fever ?Associated symptoms: congestion, cough, myalgias and rhinorrhea   ?Associated symptoms: no chest pain, no confusion, no diarrhea, no dysuria, no headaches, no rash and no vomiting   ? ?  ? ?Home Medications ?Prior to Admission medications   ?Not on File  ?   ? ?Allergies    ?Patient has no known allergies.   ? ?Review of Systems   ?Review of Systems  ?Constitutional:  Positive for fever.  ?HENT:  Positive for congestion and rhinorrhea.   ?Eyes:  Negative for redness.  ?Respiratory:  Positive for cough and shortness of breath.   ?Cardiovascular:  Negative for chest pain.  ?Gastrointestinal:  Negative for abdominal pain, diarrhea and vomiting.  ?Genitourinary:  Negative for dysuria and flank pain.  ?Musculoskeletal:  Positive for myalgias. Negative for neck pain and neck stiffness.  ?Skin:  Negative for rash.  ?Neurological:  Negative for headaches.   ?Hematological:  Does not bruise/bleed easily.  ?Psychiatric/Behavioral:  Negative for confusion.   ? ?Physical Exam ?Updated Vital Signs ?BP 107/66   Pulse 72   Temp (!) 100.8 ?F (38.2 ?C) (Oral)   Resp 18   Ht 1.854 m (6\' 1" )   Wt 127 kg   SpO2 94%   BMI 36.94 kg/m?  ?Physical Exam ?Vitals and nursing note reviewed.  ?Constitutional:   ?   Appearance: Normal appearance. He is well-developed.  ?HENT:  ?   Head: Atraumatic.  ?   Nose: Nose normal.  ?   Mouth/Throat:  ?   Mouth: Mucous membranes are moist.  ?   Pharynx: Oropharynx is clear.  ?Eyes:  ?   General: No scleral icterus. ?   Conjunctiva/sclera: Conjunctivae normal.  ?Neck:  ?   Trachea: No tracheal deviation.  ?   Comments: No stiffness or rigidity.  ?Cardiovascular:  ?   Rate and Rhythm: Normal rate and regular rhythm.  ?   Pulses: Normal pulses.  ?   Heart sounds: Normal heart sounds. No murmur heard. ?  No friction rub. No gallop.  ?Pulmonary:  ?   Effort: Pulmonary effort is normal. No accessory muscle usage or respiratory distress.  ?   Breath sounds: Normal breath sounds.  ?Abdominal:  ?   General: Bowel sounds are normal. There is no distension.  ?   Palpations: Abdomen is soft.  ?   Tenderness: There  is no abdominal tenderness. There is no guarding.  ?Genitourinary: ?   Comments: No cva tenderness. ?Musculoskeletal:     ?   General: No swelling or tenderness.  ?   Cervical back: Normal range of motion and neck supple. No rigidity.  ?   Right lower leg: No edema.  ?   Left lower leg: No edema.  ?Skin: ?   General: Skin is warm and dry.  ?   Findings: No rash.  ?Neurological:  ?   Mental Status: He is alert.  ?   Comments: Alert, speech clear.   ?Psychiatric:     ?   Mood and Affect: Mood normal.  ? ? ?ED Results / Procedures / Treatments   ?Labs ?(all labs ordered are listed, but only abnormal results are displayed) ?Results for orders placed or performed during the hospital encounter of 05/06/21  ?Resp Panel by RT-PCR (Flu A&B, Covid)  Nasopharyngeal Swab  ? Specimen: Nasopharyngeal Swab; Nasopharyngeal(NP) swabs in vial transport medium  ?Result Value Ref Range  ? SARS Coronavirus 2 by RT PCR POSITIVE (A) NEGATIVE  ? Influenza A by PCR NEGATIVE NEGATIVE  ? Influenza B by PCR NEGATIVE NEGATIVE  ?Lactic acid, plasma  ?Result Value Ref Range  ? Lactic Acid, Venous 1.5 0.5 - 1.9 mmol/L  ?Comprehensive metabolic panel  ?Result Value Ref Range  ? Sodium 138 135 - 145 mmol/L  ? Potassium 3.6 3.5 - 5.1 mmol/L  ? Chloride 109 98 - 111 mmol/L  ? CO2 22 22 - 32 mmol/L  ? Glucose, Bld 123 (H) 70 - 99 mg/dL  ? BUN 11 8 - 23 mg/dL  ? Creatinine, Ser 0.82 0.61 - 1.24 mg/dL  ? Calcium 8.3 (L) 8.9 - 10.3 mg/dL  ? Total Protein 6.4 (L) 6.5 - 8.1 g/dL  ? Albumin 3.5 3.5 - 5.0 g/dL  ? AST 20 15 - 41 U/L  ? ALT 23 0 - 44 U/L  ? Alkaline Phosphatase 58 38 - 126 U/L  ? Total Bilirubin 1.8 (H) 0.3 - 1.2 mg/dL  ? GFR, Estimated >60 >60 mL/min  ? Anion gap 7 5 - 15  ?CBC with Differential  ?Result Value Ref Range  ? WBC 10.6 (H) 4.0 - 10.5 K/uL  ? RBC 5.16 4.22 - 5.81 MIL/uL  ? Hemoglobin 14.9 13.0 - 17.0 g/dL  ? HCT 44.8 39.0 - 52.0 %  ? MCV 86.8 80.0 - 100.0 fL  ? MCH 28.9 26.0 - 34.0 pg  ? MCHC 33.3 30.0 - 36.0 g/dL  ? RDW 13.8 11.5 - 15.5 %  ? Platelets 209 150 - 400 K/uL  ? nRBC 0.0 0.0 - 0.2 %  ? Neutrophils Relative % 79 %  ? Neutro Abs 8.5 (H) 1.7 - 7.7 K/uL  ? Lymphocytes Relative 7 %  ? Lymphs Abs 0.7 0.7 - 4.0 K/uL  ? Monocytes Relative 8 %  ? Monocytes Absolute 0.9 0.1 - 1.0 K/uL  ? Eosinophils Relative 4 %  ? Eosinophils Absolute 0.4 0.0 - 0.5 K/uL  ? Basophils Relative 1 %  ? Basophils Absolute 0.1 0.0 - 0.1 K/uL  ? Immature Granulocytes 1 %  ? Abs Immature Granulocytes 0.08 (H) 0.00 - 0.07 K/uL  ?Urinalysis, Routine w reflex microscopic Urine, Clean Catch  ?Result Value Ref Range  ? Color, Urine YELLOW YELLOW  ? APPearance CLEAR CLEAR  ? Specific Gravity, Urine 1.025 1.005 - 1.030  ? pH 5.0 5.0 - 8.0  ? Glucose, UA NEGATIVE NEGATIVE mg/dL  ?  Hgb urine  dipstick SMALL (A) NEGATIVE  ? Bilirubin Urine NEGATIVE NEGATIVE  ? Ketones, ur NEGATIVE NEGATIVE mg/dL  ? Protein, ur NEGATIVE NEGATIVE mg/dL  ? Nitrite NEGATIVE NEGATIVE  ? Leukocytes,Ua NEGATIVE NEGATIVE  ? RBC / HPF 0-5 0 - 5 RBC/hpf  ? WBC, UA 0-5 0 - 5 WBC/hpf  ? Bacteria, UA NONE SEEN NONE SEEN  ? Mucus PRESENT   ? ?DG Chest 2 View ? ?Result Date: 05/06/2021 ?CLINICAL DATA:  Shortness of breath with cough for 2 days. Diagnosed with COVID today. EXAM: CHEST - 2 VIEW COMPARISON:  Radiographs 09/18/2019 and 09/03/2019.  CT 11/16/2019. FINDINGS: The heart size and mediastinal contours are stable. The extensive postinflammatory scarring demonstrated in both lungs on prior radiographs and CT has improved. There is still mild residual architectural distortion and central airway thickening. No signs of acute superimposed airspace disease, edema, pleural effusion or pneumothorax. Mild degenerative changes in the spine. IMPRESSION: Postinflammatory scarring in both lungs has improved compared with prior radiographs and CT from 2021. No definite acute superimposed process. Short-term radiographic follow-up may be considered if the patient has progressive symptoms. Electronically Signed   By: Carey Bullocks M.D.   On: 05/06/2021 12:37   ? ? ? ?EKG ?EKG Interpretation ? ?Date/Time:  Tuesday May 06 2021 11:18:08 EDT ?Ventricular Rate:  92 ?PR Interval:  138 ?QRS Duration: 82 ?QT Interval:  364 ?QTC Calculation: 450 ?R Axis:   -33 ?Text Interpretation: Normal sinus rhythm Left axis deviation Nonspecific T wave abnormality Confirmed by Cathren Laine ([id number]) on 05/06/2021 2:30:22 PM ? ?Radiology ?DG Chest 2 View ? ?Result Date: 05/06/2021 ?CLINICAL DATA:  Shortness of breath with cough for 2 days. Diagnosed with COVID today. EXAM: CHEST - 2 VIEW COMPARISON:  Radiographs 09/18/2019 and 09/03/2019.  CT 11/16/2019. FINDINGS: The heart size and mediastinal contours are stable. The extensive postinflammatory scarring demonstrated  in both lungs on prior radiographs and CT has improved. There is still mild residual architectural distortion and central airway thickening. No signs of acute superimposed airspace disease, edema, pleural effusion or

## 2021-05-06 NOTE — Discharge Instructions (Addendum)
It was our pleasure to provide your ER care today - we hope that you feel better. ? ?Drink plenty of fluids/stay well hydrated. Stay active, take full and deep breaths.  ? ?Take molnupiravir as prescribed, use albuterol inhaler as need. Take ibuprofen or acetaminophen as need for fever or body aches.  ? ?Follow up with primary care doctor in two weeks time if symptoms fail to improve/resolve. ? ?Return to ER if worse, new symptoms, increased trouble breathing, or other concern.  ?

## 2021-05-06 NOTE — ED Triage Notes (Addendum)
Pt. Stated, I tested Saturday for COVID on Saturday and neg. Today is negative. I have symptoms o f SOB, chills fever, body aches all over with a bad cough. I had COVID in NOV. And was admitted for 11days.  ?I took Ibuprofen at 830. ?

## 2022-04-25 IMAGING — DX DG CHEST 1V PORT
1 series · 1 of 1 positions shown · non-contrast
Comparison: 08/31/2010

CLINICAL DATA: Shortness of breath, [id number] positivity

EXAM:
PORTABLE CHEST 1 VIEW

[chest]
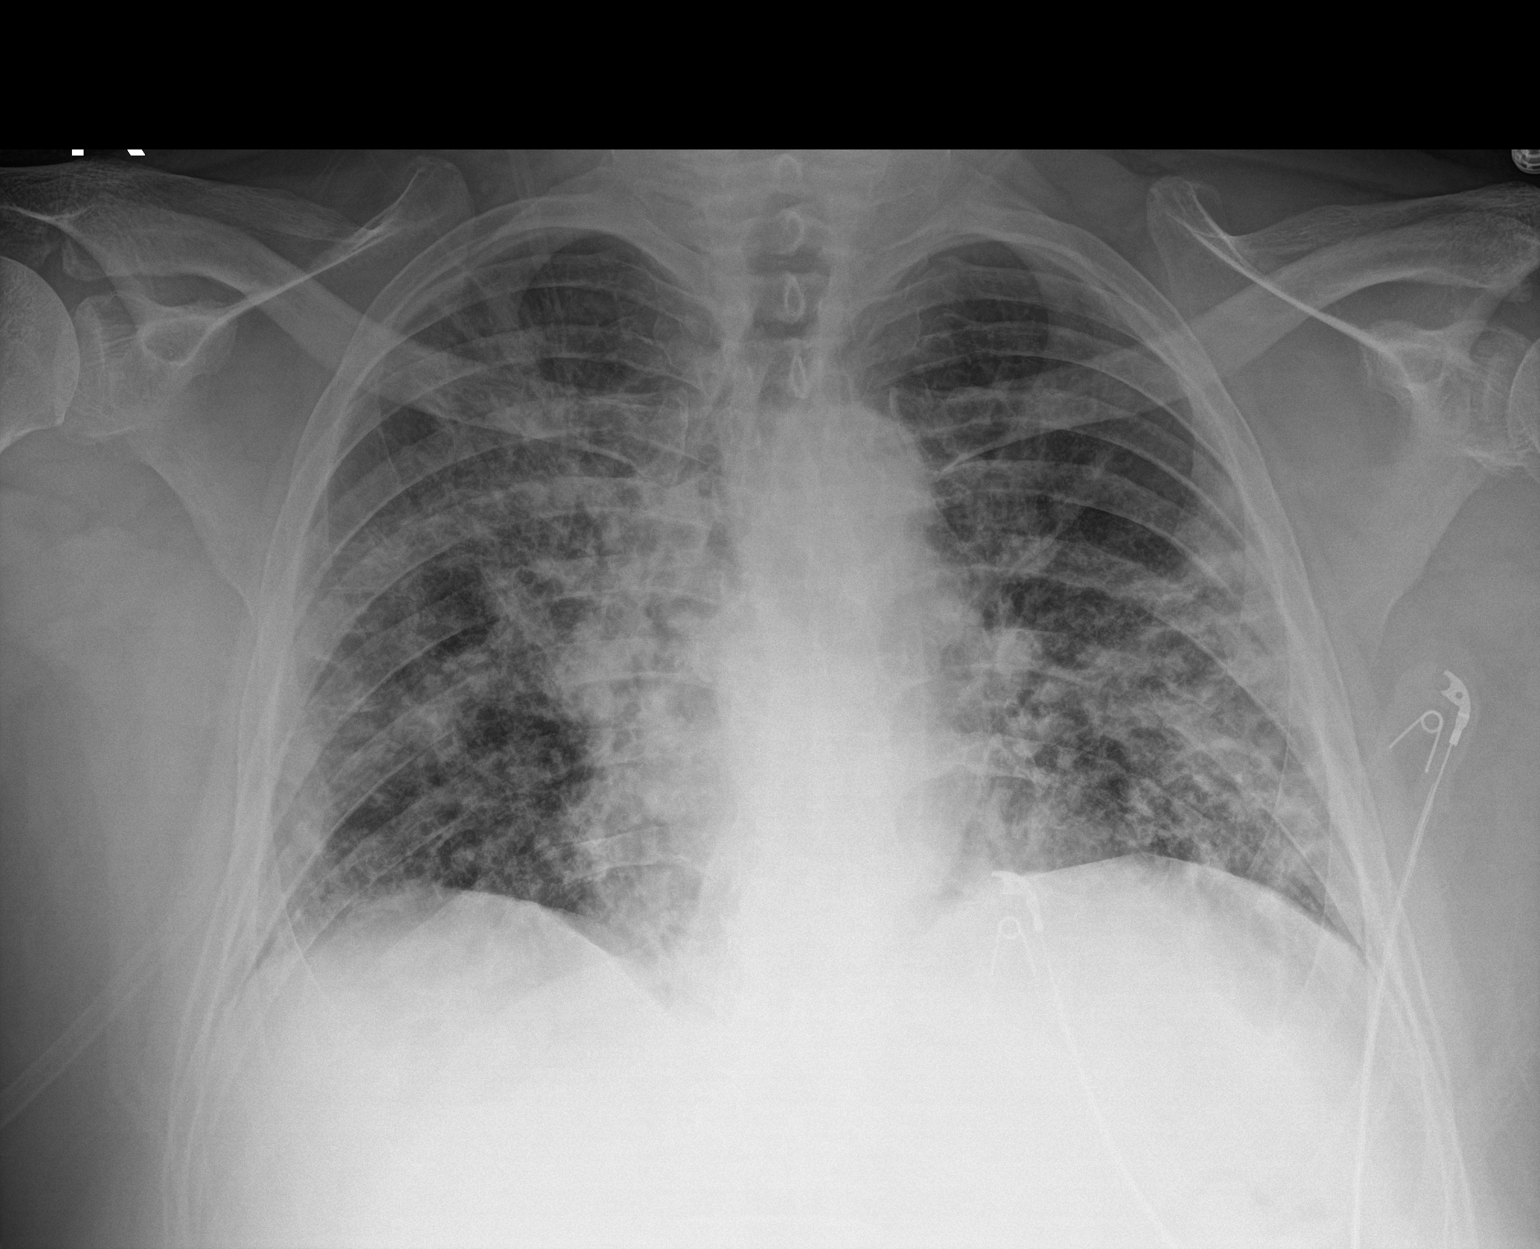

[1 of 1 positions shown; findings below may reference images not displayed]

FINDINGS: Cardiac shadow is stable. The lungs are well aerated bilaterally.
Slight increase in airspace opacity is seen bilaterally without
pneumothorax or effusion. No bony abnormality is noted.
IMPRESSION: Slight worsening of bilateral airspace disease consistent with the
given clinical history.

## 2022-07-08 IMAGING — CT CT CHEST HIGH RESOLUTION W/O CM
2 of 7 series · 14 of 36 positions shown, 17 images · non-contrast
Comparison: None.

CLINICAL DATA: Post COVID pneumonia, evaluate for ILD

EXAM:
CT CHEST WITHOUT CONTRAST
TECHNIQUE: Multidetector CT imaging of the chest was performed following the
standard protocol without intravenous contrast. High resolution
imaging of the lungs, as well as inspiratory and expiratory imaging,
was performed.

[Series 4: high resolution · axial · 0.73mm/px · z∈[-344,-94]mm · 11 of 303 slices shown, 14 images]
[im 26/303  mediastinal]
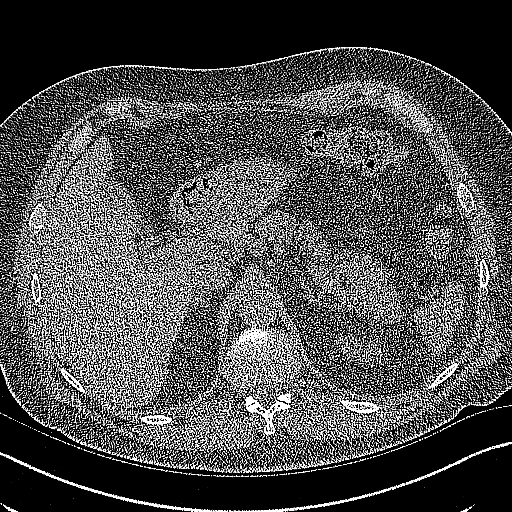
[im 26/303  lung]
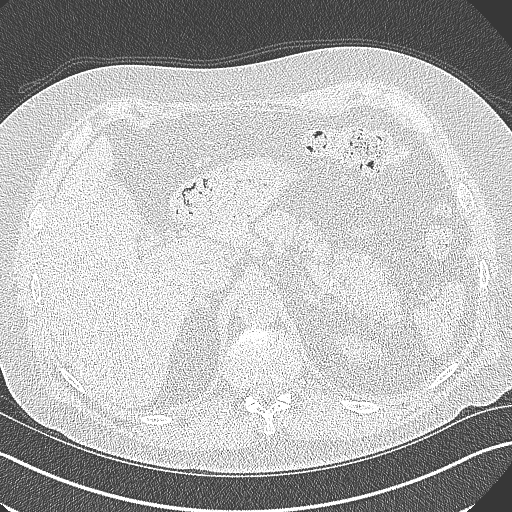
[im 51/303  lung]
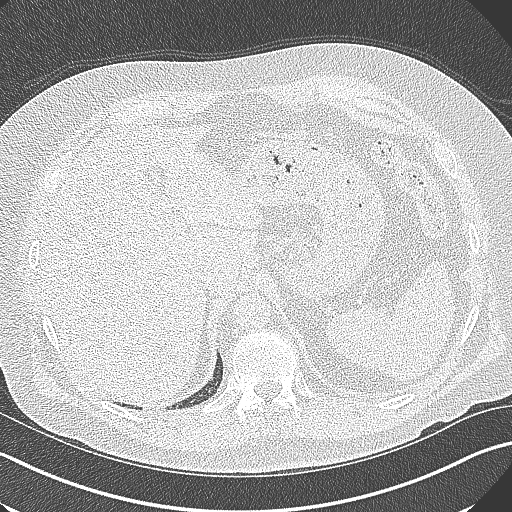
[im 76/303  lung]
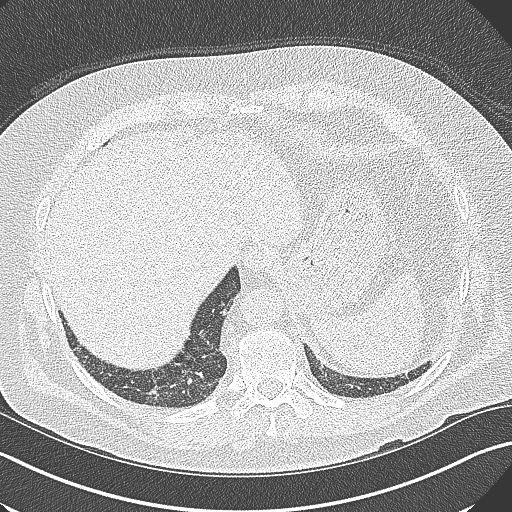
[im 101/303  lung]
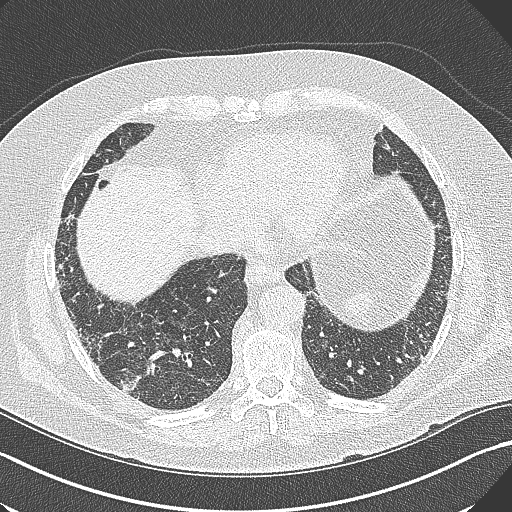
[im 126/303  mediastinal]
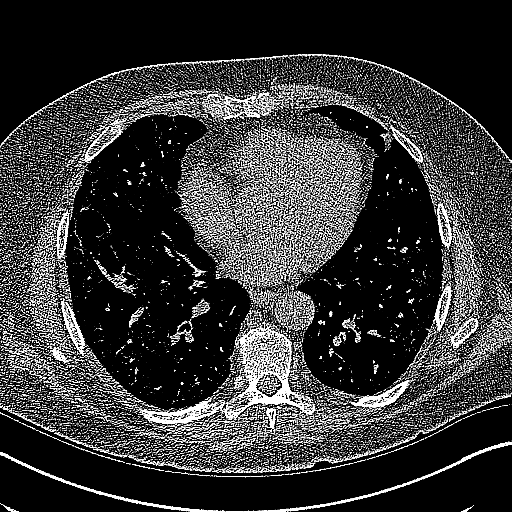
[im 126/303  lung]
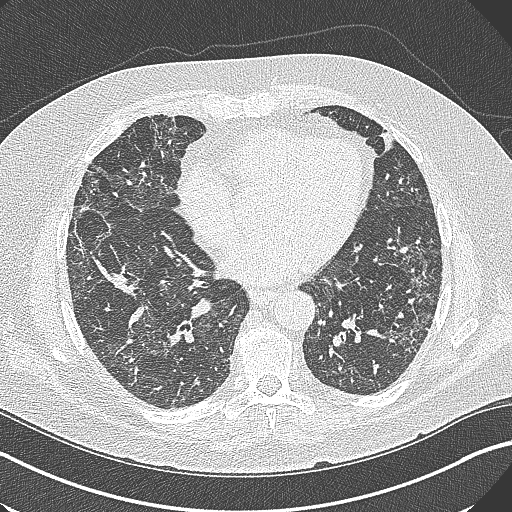
[im 152/303  lung]
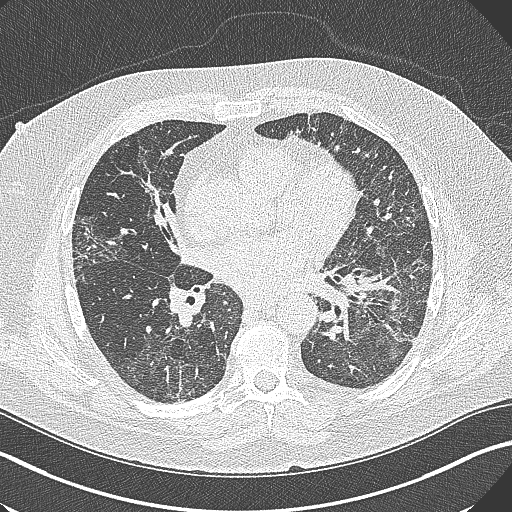
[im 177/303  lung]
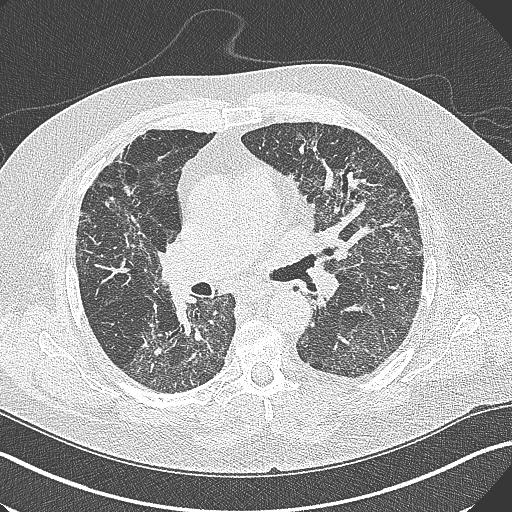
[im 202/303  lung]
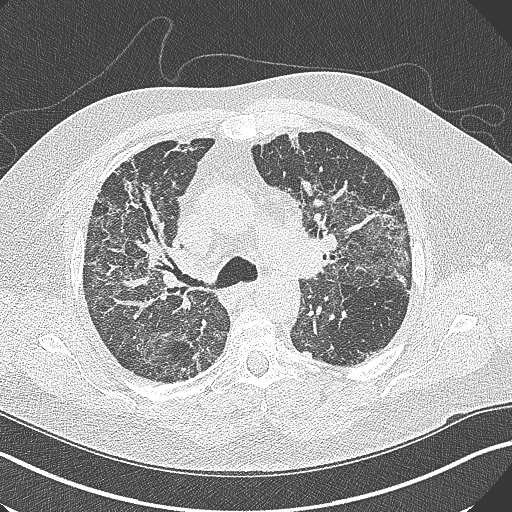
[im 227/303  mediastinal]
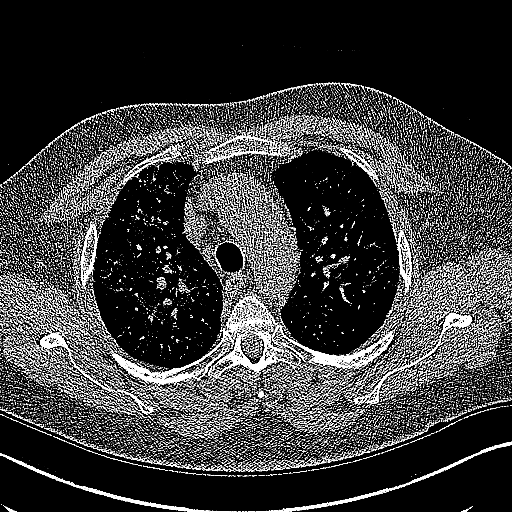
[im 227/303  lung]
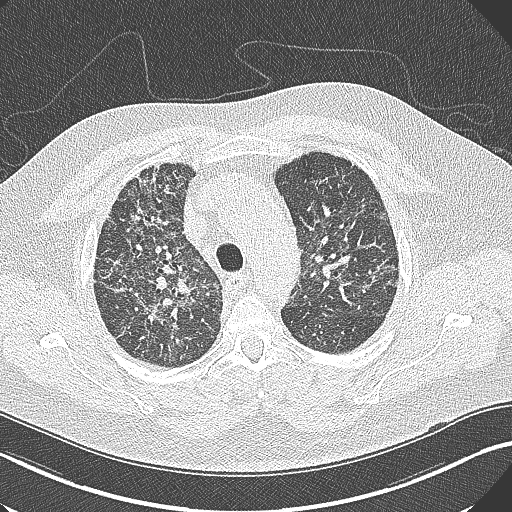
[im 252/303  lung]
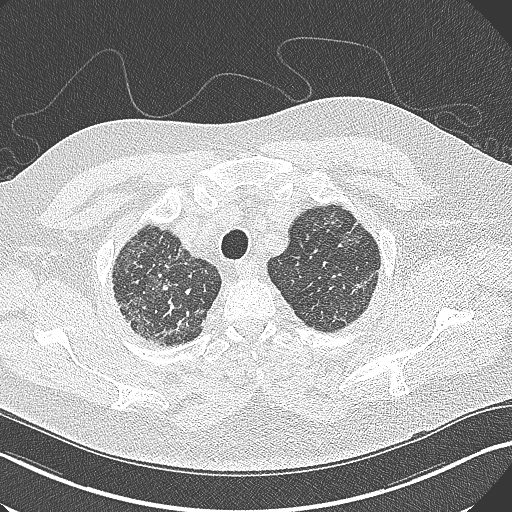
[im 277/303  lung]
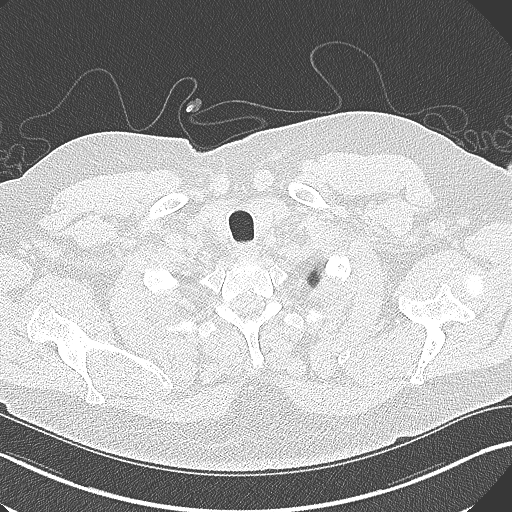

[Series 6: coronal · coronal · 0.62mm/px · 3 of 136 slices shown]
[im 28/136  lung]
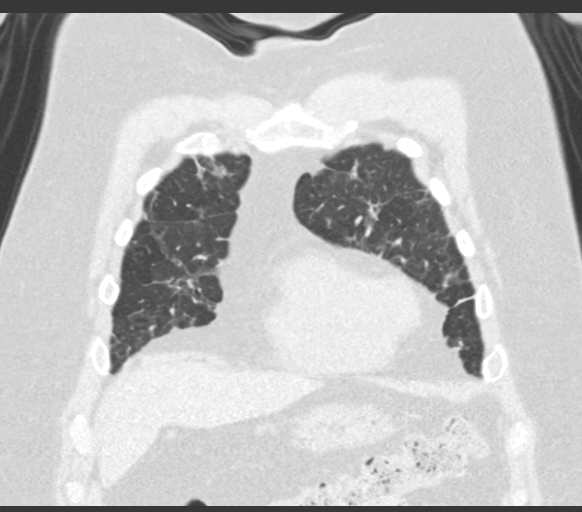
[im 55/136  lung]
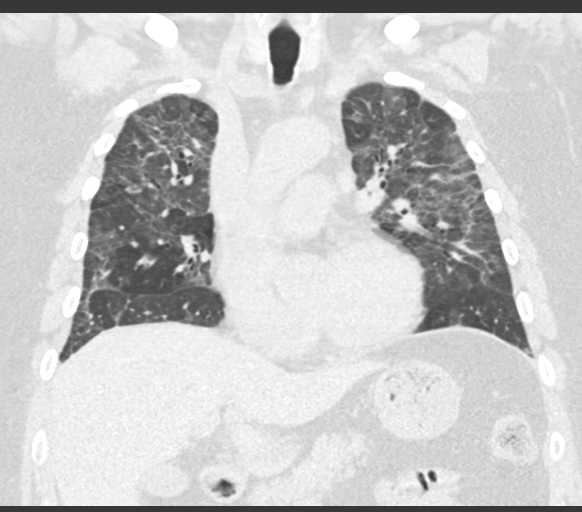
[im 82/136  lung]
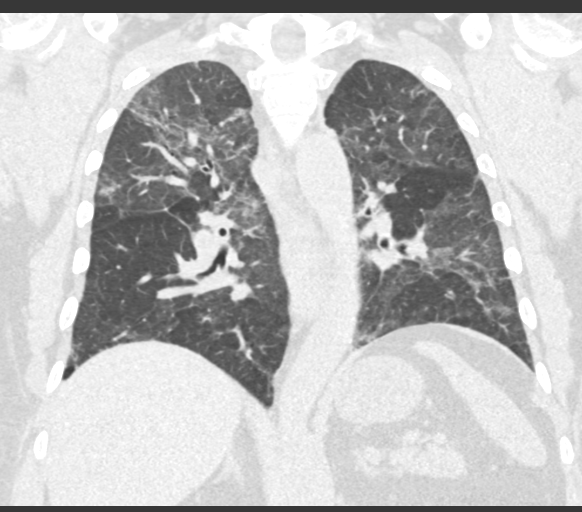

[14 of 36 positions shown; findings below may reference images not displayed]

FINDINGS: Cardiovascular: No significant vascular findings. Normal heart size.
Left coronary artery calcifications. No pericardial effusion.

Mediastinum/Nodes: No enlarged mediastinal, hilar, or axillary lymph
nodes. Thyroid gland, trachea, and esophagus demonstrate no
significant findings.

Lungs/Pleura: Diffuse bilateral bronchial wall thickening. There is
moderate pulmonary fibrosis without clear apical to basal gradient,
featuring extensive peripheral and peribronchovascular irregular
interstitial opacity, ground-glass, and septal thickening, with mild
tubular bronchiectasis throughout. Lobular to segmental air trapping
on expiratory phase imaging. No pleural effusion or pneumothorax.

Upper Abdomen: No acute abnormality.

Musculoskeletal: No chest wall mass or suspicious bone lesions
identified.
IMPRESSION: 1. There is moderate pulmonary fibrosis without clear apical to
basal gradient, featuring extensive peripheral and
peribronchovascular irregular interstitial opacity, ground-glass,
and septal thickening, with mild tubular bronchiectasis throughout.
Lobular to segmental air trapping on expiratory phase imaging. These
may reflect bland post infectious or inflammatory fibrosis, however
if characterized by ATS pulmonary fibrosis criteria findings are in
an "alternative diagnosis" pattern with differential considerations
of chronic fibrosing hypersensitivity pneumonitis and NSIP.
2. Diffuse bilateral bronchial wall thickening, consistent with
nonspecific infectious or inflammatory bronchitis.
3. Coronary artery disease.

## 2022-11-13 DIAGNOSIS — R9431 Abnormal electrocardiogram [ECG] [EKG]: Secondary | ICD-10-CM | POA: Insufficient documentation

## 2022-11-13 DIAGNOSIS — R072 Precordial pain: Secondary | ICD-10-CM | POA: Insufficient documentation

## 2023-01-14 ENCOUNTER — Emergency Department (HOSPITAL_COMMUNITY)
Admission: EM | Admit: 2023-01-14 | Discharge: 2023-01-15 | Discharge status: Against Medical Advice | Payer: Medicare Other | Attending: Emergency Medicine | Admitting: Emergency Medicine

## 2023-01-14 ENCOUNTER — Other Ambulatory Visit: Payer: Self-pay

## 2023-01-14 ENCOUNTER — Emergency Department (HOSPITAL_COMMUNITY): Payer: Medicare Other

## 2023-01-14 DIAGNOSIS — Z5321 Procedure and treatment not carried out due to patient leaving prior to being seen by health care provider: Secondary | ICD-10-CM | POA: Diagnosis not present

## 2023-01-14 DIAGNOSIS — R079 Chest pain, unspecified: Secondary | ICD-10-CM | POA: Insufficient documentation

## 2023-01-14 DIAGNOSIS — R9431 Abnormal electrocardiogram [ECG] [EKG]: Secondary | ICD-10-CM | POA: Diagnosis not present

## 2023-01-14 DIAGNOSIS — R0609 Other forms of dyspnea: Secondary | ICD-10-CM | POA: Insufficient documentation

## 2023-01-14 DIAGNOSIS — R5381 Other malaise: Secondary | ICD-10-CM | POA: Insufficient documentation

## 2023-01-14 DIAGNOSIS — Z20822 Contact with and (suspected) exposure to covid-19: Secondary | ICD-10-CM | POA: Insufficient documentation

## 2023-01-14 DIAGNOSIS — R0989 Other specified symptoms and signs involving the circulatory and respiratory systems: Secondary | ICD-10-CM | POA: Insufficient documentation

## 2023-01-14 DIAGNOSIS — R059 Cough, unspecified: Secondary | ICD-10-CM | POA: Diagnosis not present

## 2023-01-14 DIAGNOSIS — Z1152 Encounter for screening for COVID-19: Secondary | ICD-10-CM | POA: Insufficient documentation

## 2023-01-14 LAB — CBC
HCT: 47.7 % (ref 39.0–52.0)
Hemoglobin: 15.7 g/dL (ref 13.0–17.0)
MCH: 28.5 pg (ref 26.0–34.0)
MCHC: 32.9 g/dL (ref 30.0–36.0)
MCV: 86.6 fL (ref 80.0–100.0)
Platelets: 287 10*3/uL (ref 150–400)
RBC: 5.51 MIL/uL (ref 4.22–5.81)
RDW: 13.9 % (ref 11.5–15.5)
WBC: 9.2 10*3/uL (ref 4.0–10.5)
nRBC: 0 % (ref 0.0–0.2)

## 2023-01-14 LAB — TROPONIN I (HIGH SENSITIVITY)
Troponin I (High Sensitivity): 5 ng/L (ref <18)
Troponin I (High Sensitivity): 4 ng/L (ref <18)

## 2023-01-14 LAB — BASIC METABOLIC PANEL
Anion gap: 10 (ref 5–15)
BUN: 19 mg/dL (ref 8–23)
CO2: 23 mmol/L (ref 22–32)
Calcium: 9 mg/dL (ref 8.9–10.3)
Chloride: 105 mmol/L (ref 98–111)
Creatinine, Ser: 1.07 mg/dL (ref 0.61–1.24)
GFR, Estimated: >60 mL/min (ref >60)
Glucose, Bld: 100 mg/dL — ABNORMAL HIGH (ref 70–99)
Potassium: 3.8 mmol/L (ref 3.5–5.1)
Sodium: 138 mmol/L (ref 135–145)

## 2023-01-14 LAB — RESP PANEL BY RT-PCR (RSV, FLU A&B, COVID)  RVPGX2
Influenza A by PCR: NEGATIVE
Influenza B by PCR: NEGATIVE
Resp Syncytial Virus by PCR: NEGATIVE
SARS Coronavirus 2 by RT PCR: NEGATIVE

## 2023-01-14 NOTE — ED Triage Notes (Signed)
Pt c/o CP and DOE since 12/24. Pt also c/o URI symptoms including malaise, cough, congestion.

## 2023-01-15 ENCOUNTER — Encounter (HOSPITAL_BASED_OUTPATIENT_CLINIC_OR_DEPARTMENT_OTHER): Payer: Self-pay

## 2023-01-15 ENCOUNTER — Other Ambulatory Visit (HOSPITAL_BASED_OUTPATIENT_CLINIC_OR_DEPARTMENT_OTHER): Payer: Self-pay

## 2023-01-15 ENCOUNTER — Ambulatory Visit (HOSPITAL_BASED_OUTPATIENT_CLINIC_OR_DEPARTMENT_OTHER)
Admission: RE | Admit: 2023-01-15 | Discharge: 2023-01-15 | Disposition: A | Discharge status: Home / Self Care | Payer: Medicare Other | Source: Ambulatory Visit | Attending: Internal Medicine | Admitting: Internal Medicine

## 2023-01-15 VITALS — BP 122/83 | HR 80 | Temp 98.3°F | Resp 20

## 2023-01-15 DIAGNOSIS — J22 Unspecified acute lower respiratory infection: Secondary | ICD-10-CM

## 2023-01-15 MED ORDER — ALBUTEROL SULFATE HFA 108 (90 BASE) MCG/ACT IN AERS
2.0000 | INHALATION_SPRAY | Freq: Four times a day (QID) | RESPIRATORY_TRACT | 1 refills | Status: DC | PRN
  Filled 2023-01-15: qty 6.7, 25d supply, fill #0

## 2023-01-15 MED ORDER — ALBUTEROL SULFATE (2.5 MG/3ML) 0.083% IN NEBU
2.5000 mg | INHALATION_SOLUTION | Freq: Once | RESPIRATORY_TRACT | Status: AC
  Administered 2023-01-15: 2.5 mg via RESPIRATORY_TRACT

## 2023-01-15 MED ORDER — AMOXICILLIN 500 MG PO CAPS
1000.0000 mg | ORAL_CAPSULE | Freq: Two times a day (BID) | ORAL | 0 refills | Status: AC
  Filled 2023-01-15: qty 20, 5d supply, fill #0

## 2023-01-15 MED ORDER — AZITHROMYCIN 250 MG PO TABS
ORAL_TABLET | ORAL | 0 refills | Status: AC
  Filled 2023-01-15: qty 6, 5d supply, fill #0

## 2023-01-15 NOTE — Discharge Instructions (Addendum)
See your doctor on Monday for recheck Go to the emergency department for symptoms new symptoms or concerns Use your inhaler and nebulizer as previously prescribed

## 2023-01-15 NOTE — ED Provider Notes (Signed)
[facility]    CSN: [id number] Arrival date & time: 01/15/23  1508      History   Chief Complaint Chief Complaint  Patient presents with   Generalized Body Aches    Upper respiratory infection Severe pain in chest, back all around my lungs.I went to theER at [facility] yesterday and got my blood work and ekg and chest x-rays done but after 8 hours of waiting for the doctor I had to leave. - Entered by patient    HPI Jacob Page. is a 67 y.o. male.   The history is provided by the patient.   Cough for 3 weeks mostly nonproductive but did have some brown sputum yesterday.  States went to the emergency department yesterday had some test done waited approximately 8 hours never saw physician left without completion of treatment. Admits diffuse soreness on his trunk states it hurts to touch his skin. Denies fever, chills, sweats, abdominal pain, nausea, vomiting, lower extremity pain or swelling. States has had respiratory issues including cough since 2022/04/10. Has been seen by cardiology and pulmonology, had a stress test and an echo in November.  States was treated with CPAP then BiPAP, antibiotics and an inhaler.  Had COVID April 2023  Results from ED visit yesterday reviewed CMP normal except for blood sugar of 100, CBC normal no elevated WBC count 2 troponin levels were done first 1 was 5-second 1 4 which is normal, Pitt[place] panel was negative for COVID flu and RSV, EKG results are not visible, Chest x-ray report shows normal heart size diffuse pulmonary interstitial prominence could be seen with atypical infection edema or interstitial lung disease. Past Medical History:  Diagnosis Date   History of kidney stones    "I had kidney stones around 202004/03/22, but they passed on their own"   Pneumonia    "diagnosed maybe 10 years ago"   Sleep apnea    "did a sleep study where you stay overnight about 35 years ago in [place], but I don't wear a CPAP because  I feel fine"    Patient Active Problem List   Diagnosis Date Noted   Pneumonia due to COVID-19 virus 08/28/2019   S/P hernia repair 04/26/2019    Past Surgical History:  Procedure Laterality Date   DENTAL SURGERY  2019   Per pt "I have implants on the upper front teeth, they don't come out. They gave me gas for the procedure"   EXPLORATORY LAPAROTOMY  04/26/2019   HEMORROIDECTOMY     INCISIONAL HERNIA REPAIR  04/26/2019   WITH MESH   INCISIONAL HERNIA REPAIR N/A 04/26/2019   Procedure: INCISIONAL HERNIA REPAIR WITH MESH (TAR);  Surgeon: Axel Filler, MD;  Location: [facility] OR;  Service: General;  Laterality: N/A;   INSERTION OF MESH N/A 04/26/2019   Procedure: Insertion Of Mesh;  Surgeon: Axel Filler, MD;  Location: [facility] OR;  Service: General;  Laterality: N/A;   LAPAROTOMY N/A 04/26/2019   Procedure: EXPLORATORY LAPAROTOMY;  Surgeon: Axel Filler, MD;  Location: [facility] OR;  Service: General;  Laterality: N/A;   LYSIS OF ADHESION N/A 04/26/2019   Procedure: LYSIS OF ADHESION;  Surgeon: Axel Filler, MD;  Location: [facility] OR;  Service: General;  Laterality: N/A;       Home Medications    Prior to Admission medications   Medication Sig Start Date End Date Taking? Authorizing Provider  albuterol (VENTOLIN HFA) 108 (90 Base) MCG/ACT inhaler Inhale 2 puffs into the lungs every 4 (  four) hours as needed for wheezing or shortness of breath. 05/06/21   Cathren Laine, MD    Family History History reviewed. No pertinent family history.  Social History Social History   Tobacco Use   Smoking status: Former   Smokeless tobacco: Never   Tobacco comments:    pt quit smoking at age 38  Vaping Use   Vaping status: Never Used  Substance Use Topics   Alcohol use: Not Currently   Drug use: Not Currently    Types: Other-see comments    Comment: CBD oil      Allergies   Patient has no known allergies.   Review of Systems Review of Systems   Physical Exam Triage Vital Signs ED  Triage Vitals  Encounter Vitals Group     BP 01/15/23 1527 122/83     Systolic BP Percentile --      Diastolic BP Percentile --      Pulse Rate 01/15/23 1527 76     Resp 01/15/23 1527 20     Temp 01/15/23 1527 98.3 F (36.8 C)     Temp Source 01/15/23 1527 Oral     SpO2 01/15/23 1527 94 %     Weight --      Height --      Head Circumference --      Peak Flow --      Pain Score 01/15/23 1526 7     Pain Loc --      Pain Education --      Exclude from Growth Chart --    No data found.  Updated Vital Signs BP 122/83 (BP Location: Right Arm)   Pulse 76   Temp 98.3 F (36.8 C) (Oral)   Resp 20   SpO2 94%   Visual Acuity Right Eye Distance:   Left Eye Distance:   Bilateral Distance:    Right Eye Near:   Left Eye Near:    Bilateral Near:     Physical Exam Vitals and nursing note reviewed.  Constitutional:      Appearance: He is not ill-appearing.  HENT:     Head: Normocephalic and atraumatic.     Right Ear: Tympanic membrane and ear canal normal.     Left Ear: Tympanic membrane normal.     Nose: No rhinorrhea.     Mouth/Throat:     Mouth: Mucous membranes are moist.  Eyes:     Conjunctiva/sclera: Conjunctivae normal.  Cardiovascular:     Rate and Rhythm: Normal rate and regular rhythm.     Heart sounds: Normal heart sounds.  Pulmonary:     Breath sounds: Wheezing present. No rhonchi.     Comments: Decreased air movement Musculoskeletal:     Cervical back: Neck supple.     Right lower leg: No edema.     Left lower leg: No edema.  Skin:    General: Skin is warm and dry.  Neurological:     Mental Status: He is alert and oriented to person, place, and time.      UC Treatments / Results  Labs (all labs ordered are listed, but only abnormal results are displayed) Labs Reviewed - No data to display  EKG   Radiology DG Chest 2 View Result Date: 01/14/2023 CLINICAL DATA:  CP EXAM: CHEST - 2 VIEW COMPARISON:  05/06/2021. FINDINGS: The heart size and  mediastinal contours are within normal limits. There is diffuse pulmonary interstitial prominence which could be seen with atypical infection, edema or interstitial  lung disease. There is no focal consolidation. No pneumothorax or pleural effusion. Aorta is calcified. The visualized osseous structures are unremarkable. IMPRESSION: Nonspecific interstitial prominence consistent with atypical infection, edema or interstitial lung disease. No focal consolidation. Electronically Signed   By: Layla Maw M.D.   On: 01/14/2023 17:14    Procedures Procedures (including critical care time)  Medications Ordered in UC Medications  albuterol (PROVENTIL) (2.5 MG/3ML) 0.083% nebulizer solution 2.5 mg (2.5 mg Nebulization Given 01/15/23 1549)    Initial Impression / Assessment and Plan / UC Course  I have reviewed the triage vital signs and the nursing notes.  Pertinent labs & imaging results that were available during my care of the patient were reviewed by me and considered in my medical decision making (see chart for details).   67 year old male with chronic cough since March, worse over the last 3 weeks, seen in the ED yesterday but left without completion of treatment.  His EKG shows findings which could be atypical infection, given his reports of worsening cough for 3 weeks will treat with antibiotics.  Patient examined after neb treatment, states he feels better, he has increased breath sounds and decreased wheezing on exam he was counseled to follow-up with his PCP, cardiologist and pulmonologist, he needs a refill of his inhaler, given strict ED precautions Final Clinical Impressions(s) / UC Diagnoses   Final diagnoses:  None   Discharge Instructions   None    ED Prescriptions   None    PDMP not reviewed this encounter.   Meliton Rattan, [place] 01/15/23 1614

## 2023-01-15 NOTE — ED Notes (Signed)
Pt called 3 x to be roomed with no answer

## 2023-01-15 NOTE — ED Triage Notes (Signed)
Pt was seen at ER yesterday still c/o coughing and chest congestion. Pt left prior to seeing the doctor but does report he had a chest x ray done and blood work.

## 2023-02-05 ENCOUNTER — Encounter (HOSPITAL_BASED_OUTPATIENT_CLINIC_OR_DEPARTMENT_OTHER): Payer: Self-pay

## 2023-02-05 ENCOUNTER — Ambulatory Visit (HOSPITAL_BASED_OUTPATIENT_CLINIC_OR_DEPARTMENT_OTHER)
Admission: RE | Admit: 2023-02-05 | Discharge: 2023-02-05 | Disposition: A | Discharge status: Home / Self Care | Payer: Medicare Other | Source: Ambulatory Visit | Attending: Emergency Medicine | Admitting: Emergency Medicine

## 2023-02-05 ENCOUNTER — Other Ambulatory Visit (HOSPITAL_BASED_OUTPATIENT_CLINIC_OR_DEPARTMENT_OTHER): Payer: Self-pay

## 2023-02-05 VITALS — BP 145/85 | HR 72 | Temp 98.5°F | Resp 20

## 2023-02-05 DIAGNOSIS — J22 Unspecified acute lower respiratory infection: Secondary | ICD-10-CM | POA: Diagnosis not present

## 2023-02-05 DIAGNOSIS — R062 Wheezing: Secondary | ICD-10-CM | POA: Diagnosis not present

## 2023-02-05 DIAGNOSIS — R059 Cough, unspecified: Secondary | ICD-10-CM | POA: Diagnosis not present

## 2023-02-05 MED ORDER — ALBUTEROL SULFATE HFA 108 (90 BASE) MCG/ACT IN AERS
1.0000 | INHALATION_SPRAY | Freq: Four times a day (QID) | RESPIRATORY_TRACT | 0 refills | Status: AC | PRN
  Filled 2023-02-05: qty 6.7, 25d supply, fill #0

## 2023-02-05 MED ORDER — LEVOFLOXACIN 500 MG PO TABS
500.0000 mg | ORAL_TABLET | Freq: Every day | ORAL | 0 refills | Status: DC
  Filled 2023-02-05: qty 5, 5d supply, fill #0

## 2023-02-05 MED ORDER — ALBUTEROL SULFATE (2.5 MG/3ML) 0.083% IN NEBU
2.5000 mg | INHALATION_SOLUTION | Freq: Once | RESPIRATORY_TRACT | Status: AC
  Administered 2023-02-05: 2.5 mg via RESPIRATORY_TRACT

## 2023-02-05 MED ORDER — ALBUTEROL SULFATE (5 MG/ML) 0.5% IN NEBU
2.5000 mg | INHALATION_SOLUTION | Freq: Four times a day (QID) | RESPIRATORY_TRACT | 0 refills | Status: DC | PRN
  Filled 2023-02-05: qty 20, 10d supply, fill #0

## 2023-02-05 MED ORDER — PREDNISONE 10 MG PO TABS
40.0000 mg | ORAL_TABLET | Freq: Every day | ORAL | 0 refills | Status: AC
  Filled 2023-02-05: qty 20, 5d supply, fill #0

## 2023-02-05 MED ORDER — ALBUTEROL SULFATE (2.5 MG/3ML) 0.083% IN NEBU
2.5000 mg | INHALATION_SOLUTION | Freq: Four times a day (QID) | RESPIRATORY_TRACT | 0 refills | Status: AC | PRN
  Filled 2023-02-05: qty 75, 7d supply, fill #0

## 2023-02-05 NOTE — ED Provider Notes (Signed)
Jacob Page [facility]    CSN: [id number] Arrival date & time: 02/05/23  1727      History   Chief Complaint Chief Complaint  Patient presents with   Cough    Whizzing, coughing , Sinus draining in back of throat - Entered by patient    HPI Jacob Page. is a 68 y.o. male.   Patient presents to clinic with a persistent cough and chest congestion.  He has been using his albuterol inhaler and nebulizer treatment as needed, last dose 2 nights ago.  He has been wheezing and feeling short of breath.  Will have chest tightness with coughing.  He has not had any fevers.  He was seen here at the end of December and started on amoxicillin and azithromycin for a lower respiratory tract infection.  He got better, but his symptoms returned shortly after finishing antibiotics.  He is not having any GI symptoms.  No nausea, vomiting or diarrhea.  He does have continuous postnasal drip and some sinus pressure.  No rhinorrhea.   Is established for pulmonology for respiratory issues that developed after COVID-19 infection.  The history is provided by the patient and medical records.  Cough   Past Medical History:  Diagnosis Date   History of kidney stones    "I had kidney stones around 20April 21, 2004, but they passed on their own"   Pneumonia    "diagnosed maybe 10 years ago"   Sleep apnea    "did a sleep study where you stay overnight about 35 years ago in [place], but I don't wear a CPAP because I feel fine"    Patient Active Problem List   Diagnosis Date Noted   Pneumonia due to COVID-19 virus 08/28/2019   S/P hernia repair 04/26/2019    Past Surgical History:  Procedure Laterality Date   DENTAL SURGERY  2019   Per pt "I have implants on the upper front teeth, they don't come out. They gave me gas for the procedure"   EXPLORATORY LAPAROTOMY  04/26/2019   HEMORROIDECTOMY     INCISIONAL HERNIA REPAIR  04/26/2019   WITH MESH   INCISIONAL HERNIA REPAIR N/A 04/26/2019    Procedure: INCISIONAL HERNIA REPAIR WITH MESH (TAR);  Surgeon: Axel Filler, MD;  Location: [facility] OR;  Service: General;  Laterality: N/A;   INSERTION OF MESH N/A 04/26/2019   Procedure: Insertion Of Mesh;  Surgeon: Axel Filler, MD;  Location: [facility] OR;  Service: General;  Laterality: N/A;   LAPAROTOMY N/A 04/26/2019   Procedure: EXPLORATORY LAPAROTOMY;  Surgeon: Axel Filler, MD;  Location: [facility] OR;  Service: General;  Laterality: N/A;   LYSIS OF ADHESION N/A 04/26/2019   Procedure: LYSIS OF ADHESION;  Surgeon: Axel Filler, MD;  Location: [facility] OR;  Service: General;  Laterality: N/A;       Home Medications    Prior to Admission medications   Medication Sig Start Date End Date Taking? Authorizing Provider  albuterol (PROVENTIL) (2.5 MG/3ML) 0.083% nebulizer solution Take 3 mLs (2.5 mg total) by nebulization every 6 (six) hours as needed for wheezing or shortness of breath. 02/05/23  Yes Rinaldo Ratel, [place] N, FNP  albuterol (VENTOLIN HFA) 108 (90 Base) MCG/ACT inhaler Inhale 1-2 puffs into the lungs every 6 (six) hours as needed for wheezing or shortness of breath. 02/05/23  Yes Rinaldo Ratel, [place] N, FNP  levofloxacin (LEVAQUIN) 500 MG tablet Take 1 tablet (500 mg total) by mouth daily. 02/05/23  Yes Rinaldo Ratel, [place] N, FNP  predniSONE (DELTASONE) 10 MG  tablet Take 4 tablets (40 mg total) by mouth daily for 5 days. 02/05/23 02/10/23 Yes Lakrista Scaduto, [place] N, FNP    Family History History reviewed. No pertinent family history.  Social History Social History   Tobacco Use   Smoking status: Former   Smokeless tobacco: Never   Tobacco comments:    pt quit smoking at age 49  Vaping Use   Vaping status: Never Used  Substance Use Topics   Alcohol use: Not Currently   Drug use: Not Currently    Types: Other-see comments    Comment: CBD oil      Allergies   Patient has no known allergies.   Review of Systems Review of Systems   Physical Exam Triage Vital Signs ED Triage Vitals   Encounter Vitals Group     BP 02/05/23 1735 (!) 143/88     Systolic BP Percentile --      Diastolic BP Percentile --      Pulse Rate 02/05/23 1735 72     Resp 02/05/23 1735 20     Temp 02/05/23 1735 98.5 F (36.9 C)     Temp Source 02/05/23 1735 Oral     SpO2 02/05/23 1735 93 %     Weight --      Height --      Head Circumference --      Peak Flow --      Pain Score 02/05/23 1736 6     Pain Loc --      Pain Education --      Exclude from Growth Chart --    No data found.  Updated Vital Signs BP (!) 143/88 (BP Location: Right Arm)   Pulse 72   Temp 98.5 F (36.9 C) (Oral)   Resp 20   SpO2 93%   Visual Acuity Right Eye Distance:   Left Eye Distance:   Bilateral Distance:    Right Eye Near:   Left Eye Near:    Bilateral Near:     Physical Exam Vitals and nursing note reviewed.  Constitutional:      Appearance: Normal appearance.  HENT:     Head: Normocephalic and atraumatic.     Right Ear: External ear normal.     Left Ear: External ear normal.     Nose: Nose normal.     Mouth/Throat:     Mouth: Mucous membranes are moist.     Pharynx: Posterior oropharyngeal erythema present.  Eyes:     Conjunctiva/sclera: Conjunctivae normal.  Cardiovascular:     Rate and Rhythm: Normal rate and regular rhythm.     Heart sounds: Normal heart sounds. No murmur heard. Pulmonary:     Breath sounds: Wheezing and rhonchi present.  Musculoskeletal:        General: Normal range of motion.  Skin:    General: Skin is warm and dry.  Neurological:     General: No focal deficit present.     Mental Status: He is alert and oriented to person, place, and time.  Psychiatric:        Mood and Affect: Mood normal.        Behavior: Behavior normal.      UC Treatments / Results  Labs (all labs ordered are listed, but only abnormal results are displayed) Labs Reviewed - No data to display  EKG   Radiology No results found.  Procedures Procedures (including critical care  time)  Medications Ordered in UC Medications  albuterol (PROVENTIL) (2.5 MG/3ML) 0.083%  nebulizer solution 2.5 mg (2.5 mg Nebulization Given 02/05/23 1755)    Initial Impression / Assessment and Plan / UC Course  I have reviewed the triage vital signs and the nursing notes.  Pertinent labs & imaging results that were available during my care of the patient were reviewed by me and considered in my medical decision making (see chart for details).  Vitals and triage reviewed, patient is hemodynamically stable.  Oxygenation is 93% on room air, lungs with diffuse wheezing and rhonchi.  Heart with regular rate and rhythm.  Patient does not appear to be in any acute distress.  Steroids prescribed for wheezing, will trial Levaquin for lower respiratory pathogens.  We currently do not have imaging at this facility and patient does not want to drive to receive chest x-ray.  Albuterol nebulizer given in clinic with subjective improvement.  Patient is established with pulmonology encouraged him to follow-up with their clinic for advanced evaluation if symptoms persist.  Strict emergency precautions given if symptoms evolve.  Plan of care, follow-up care and return precautions given, no questions at this time.    Final Clinical Impressions(s) / UC Diagnoses   Final diagnoses:  Lower respiratory infection  Wheezing  Cough, unspecified type     Discharge Instructions      You are being treated for a lower respiratory tract infection with Levaquin.  Take the antibiotics by mouth daily with food.  Start the steroid burst tomorrow with breakfast.  You can alternate between the albuterol nebulizer solution or your inhaler every 6 hours as needed for any wheezing or shortness of breath.   Do not hesitate to seek further care at the nearest emergency department if your symptoms worsen in any way.  Follow-up with your pulmonologist if your symptoms persist for further evaluation.      ED Prescriptions      Medication Sig Dispense Auth. Provider   levofloxacin (LEVAQUIN) 500 MG tablet Take 1 tablet (500 mg total) by mouth daily. 5 tablet Rinaldo Ratel, [place] N, [place]   predniSONE (DELTASONE) 10 MG tablet Take 4 tablets (40 mg total) by mouth daily for 5 days. 20 tablet Rinaldo Ratel, [place] N, [place]   albuterol (VENTOLIN HFA) 108 (90 Base) MCG/ACT inhaler Inhale 1-2 puffs into the lungs every 6 (six) hours as needed for wheezing or shortness of breath. 6.7 g Kaisy Severino, [place] N, FNP   albuterol (PROVENTIL) (5 MG/ML) 0.5% nebulizer solution  (Status: Discontinued) Take 0.5 mLs (2.5 mg total) by nebulization every 6 (six) hours as needed for wheezing or shortness of breath. 20 mL Rinaldo Ratel, [place] N, FNP   albuterol (PROVENTIL) (2.5 MG/3ML) 0.083% nebulizer solution Take 3 mLs (2.5 mg total) by nebulization every 6 (six) hours as needed for wheezing or shortness of breath. 75 mL Brynnly Bonet, [place] N, [place]      PDMP not reviewed this encounter.   Saje Gallop, [place] N, [place] 02/05/23 [phone]

## 2023-02-05 NOTE — Discharge Instructions (Signed)
You are being treated for a lower respiratory tract infection with Levaquin.  Take the antibiotics by mouth daily with food.  Start the steroid burst tomorrow with breakfast.  You can alternate between the albuterol nebulizer solution or your inhaler every 6 hours as needed for any wheezing or shortness of breath.   Do not hesitate to seek further care at the nearest emergency department if your symptoms worsen in any way.  Follow-up with your pulmonologist if your symptoms persist for further evaluation.

## 2023-02-05 NOTE — ED Triage Notes (Signed)
Seen on 12/27 for cough and chest congestion. Presents today for cough and "wheezing". Denies fevers. Patient has home nebulizer and has used it recently. Has seen pulmonologist as well for this.

## 2023-02-17 ENCOUNTER — Other Ambulatory Visit (HOSPITAL_BASED_OUTPATIENT_CLINIC_OR_DEPARTMENT_OTHER): Payer: Self-pay

## 2023-02-17 ENCOUNTER — Ambulatory Visit (HOSPITAL_BASED_OUTPATIENT_CLINIC_OR_DEPARTMENT_OTHER)
Admit: 2023-02-17 | Discharge: 2023-02-17 | Disposition: A | Discharge status: Home / Self Care | Payer: Medicare Other | Attending: Internal Medicine | Admitting: Radiology

## 2023-02-17 ENCOUNTER — Encounter (HOSPITAL_BASED_OUTPATIENT_CLINIC_OR_DEPARTMENT_OTHER): Payer: Self-pay

## 2023-02-17 ENCOUNTER — Ambulatory Visit (HOSPITAL_BASED_OUTPATIENT_CLINIC_OR_DEPARTMENT_OTHER)
Admission: RE | Admit: 2023-02-17 | Discharge: 2023-02-17 | Disposition: A | Discharge status: Home / Self Care | Payer: Medicare Other | Source: Ambulatory Visit | Attending: Family Medicine | Admitting: Family Medicine

## 2023-02-17 VITALS — BP 124/82 | HR 74 | Temp 98.3°F | Resp 18

## 2023-02-17 DIAGNOSIS — R051 Acute cough: Secondary | ICD-10-CM | POA: Diagnosis not present

## 2023-02-17 DIAGNOSIS — J189 Pneumonia, unspecified organism: Secondary | ICD-10-CM

## 2023-02-17 DIAGNOSIS — R059 Cough, unspecified: Secondary | ICD-10-CM

## 2023-02-17 MED ORDER — CLARITHROMYCIN 500 MG PO TABS
500.0000 mg | ORAL_TABLET | Freq: Two times a day (BID) | ORAL | 0 refills | Status: AC
Start: 2023-02-17 — End: 2023-02-24
  Filled 2023-02-17: qty 14, 7d supply, fill #0

## 2023-02-17 MED ORDER — TRIAMCINOLONE ACETONIDE 40 MG/ML IJ SUSP
40.0000 mg | Freq: Once | INTRAMUSCULAR | Status: AC
  Administered 2023-02-17: 40 mg via INTRAMUSCULAR

## 2023-02-17 MED ORDER — PROMETHAZINE-DM 6.25-15 MG/5ML PO SYRP
5.0000 mL | ORAL_SOLUTION | Freq: Four times a day (QID) | ORAL | 0 refills | Status: DC | PRN
  Filled 2023-02-17: qty 118, 6d supply, fill #0

## 2023-02-17 MED ORDER — CEFTRIAXONE SODIUM 1 G IJ SOLR
1.0000 g | Freq: Once | INTRAMUSCULAR | Status: AC
  Administered 2023-02-17: 1 g via INTRAMUSCULAR

## 2023-02-17 NOTE — ED Triage Notes (Signed)
Pt reports cough and congestion for 6-8 months. Reports uses inhaler, flonase, and took antibiotics and steroids which helped temporarily.

## 2023-02-17 NOTE — ED Provider Notes (Addendum)
[name]    CSN: [id number] Arrival date & time: 02/17/23  1626      History   Chief Complaint Chief Complaint  Patient presents with   Cough    Coughing a lot , sinus drainage, chest congestion - Entered by patient   Appointment    HPI Jacob Page. is a 68 y.o. male.   Patient reports that he has had cough and chest congestion for 6 to 8 months.  He has an inhaler, and he has Flonase nasal spray.  He has been treated with steroids and antibiotics.  He was seen at [facility] right around Christmas and had a full workup including chest x-ray and blood work.  He was actually seen on 01/14/23 at the emergency room and then seen here on 01/15/2023.  He was seen again on 02/05/2023.  And he is back again with cough, congestion, fevers and soreness in his chest with deep breaths.  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   Cough Associated symptoms: rhinorrhea, shortness of breath and wheezing   Associated symptoms: no chest pain, no chills, no ear pain, no fever, no rash and no sore throat     Past Medical History:  Diagnosis Date   History of kidney stones    "I had kidney stones around 2003/21/2004, but they passed on their own"   Pneumonia    "diagnosed maybe 10 years ago"   Sleep apnea    "did a sleep study where you stay overnight about 35 years ago in [place], but I don't wear a CPAP because I feel fine"    Patient Active Problem List   Diagnosis Date Noted   Pneumonia due to COVID-19 virus 08/28/2019   S/P hernia repair 04/26/2019    Past Surgical History:  Procedure Laterality Date   DENTAL SURGERY  2019   Per pt "I have implants on the upper front teeth, they don't come out. They gave me gas for the procedure"   EXPLORATORY LAPAROTOMY   04/26/2019   HEMORROIDECTOMY     INCISIONAL HERNIA REPAIR  04/26/2019   WITH MESH   INCISIONAL HERNIA REPAIR N/A 04/26/2019   Procedure: INCISIONAL HERNIA REPAIR WITH MESH (TAR);  Surgeon: Axel Filler, MD;  Location: [facility] OR;  Service: General;  Laterality: N/A;   INSERTION OF MESH N/A 04/26/2019   Procedure: Insertion Of Mesh;  Surgeon: Axel Filler, MD;  Location: [facility] OR;  Service: General;  Laterality: N/A;   LAPAROTOMY N/A 04/26/2019   Procedure: EXPLORATORY LAPAROTOMY;  Surgeon: Axel Filler, MD;  Location: [facility] OR;  Service: General;  Laterality: N/A;   LYSIS OF ADHESION N/A 04/26/2019   Procedure: LYSIS OF ADHESION;  Surgeon: Axel Filler, MD;  Location: [facility] OR;  Service: General;  Laterality: N/A;       Home Medications    Prior to Admission medications   Medication Sig Start Date End Date Taking? Authorizing Provider  clarithromycin (BIAXIN) 500 MG tablet Take 1 tablet (500 mg total) by mouth 2 (two) times daily for 7 days. 02/17/23 02/24/23 Yes Prescilla Sours, FNP  albuterol (PROVENTIL) (2.5 MG/3ML) 0.083% nebulizer solution Take 3 mLs (2.5 mg total) by nebulization every 6 (six) hours as needed for wheezing or shortness of breath. 02/05/23  Rinaldo Ratel, [place] N, FNP  albuterol (VENTOLIN HFA) 108 (90 Base) MCG/ACT inhaler Inhale 1-2 puffs into the lungs every 6 (six) hours as needed for wheezing or shortness of breath. 02/05/23   Garrison, [place] N, FNP    Family History History reviewed. No pertinent family history.  Social History Social History   Tobacco Use   Smoking status: Former   Smokeless tobacco: Never   Tobacco comments:    pt quit smoking at age 68  Vaping Use   Vaping status: Never Used  Substance Use Topics   Alcohol use: Not Currently   Drug use: Not Currently    Types: Other-see comments    Comment: CBD oil      Allergies   Patient has no known allergies.   Review of Systems Review of Systems  Constitutional:  Negative for chills and fever.   HENT:  Positive for congestion, postnasal drip and rhinorrhea. Negative for ear pain, sinus pressure, sinus pain and sore throat.   Eyes:  Negative for pain and visual disturbance.  Respiratory:  Positive for cough, shortness of breath and wheezing.   Cardiovascular:  Negative for chest pain and palpitations.  Gastrointestinal:  Negative for abdominal pain, constipation, diarrhea, nausea and vomiting.  Genitourinary:  Negative for dysuria and hematuria.  Musculoskeletal:  Negative for arthralgias and back pain.  Skin:  Negative for color change and rash.  Neurological:  Negative for seizures and syncope.  All other systems reviewed and are negative.    Physical Exam Triage Vital Signs ED Triage Vitals [02/17/23 1637]  Encounter Vitals Group     BP 124/82     Systolic BP Percentile      Diastolic BP Percentile      Pulse Rate 74     Resp 18     Temp 98.3 F (36.8 C)     Temp Source Oral     SpO2 96 %     Weight      Height      Head Circumference      Peak Flow      Pain Score 6     Pain Loc      Pain Education      Exclude from Growth Chart    No data found.  Updated Vital Signs BP 124/82 (BP Location: Right Arm)   Pulse 74   Temp 98.3 F (36.8 C) (Oral)   Resp 18   SpO2 96%   Visual Acuity Right Eye Distance:   Left Eye Distance:   Bilateral Distance:    Right Eye Near:   Left Eye Near:    Bilateral Near:     Physical Exam Vitals and nursing note reviewed.  Constitutional:      General: He is not in acute distress.    Appearance: He is well-developed. He is not ill-appearing or toxic-appearing.  HENT:     Head: Normocephalic and atraumatic.     Right Ear: Hearing, tympanic membrane, ear canal and external ear normal.     Left Ear: Hearing, tympanic membrane, ear canal and external ear normal.     Nose: Nose normal.     Mouth/Throat:     Lips: Pink.     Mouth: Mucous membranes are moist.     Pharynx: Uvula midline. No oropharyngeal exudate or  posterior oropharyngeal erythema.     Tonsils: No tonsillar exudate.  Eyes:     Conjunctiva/sclera: Conjunctivae normal.     Pupils: Pupils are equal, round, and reactive  to light.  Cardiovascular:     Rate and Rhythm: Normal rate and regular rhythm.     Heart sounds: S1 normal and S2 normal. No murmur heard. Pulmonary:     Effort: Pulmonary effort is normal. No respiratory distress.     Breath sounds: Examination of the right-upper field reveals wheezing and rhonchi. Examination of the left-upper field reveals wheezing and rhonchi. Examination of the right-middle field reveals wheezing and rhonchi. Examination of the left-middle field reveals wheezing and rhonchi. Examination of the right-lower field reveals wheezing and rhonchi. Examination of the left-lower field reveals wheezing and rhonchi. Wheezing and rhonchi present. No decreased breath sounds or rales.  Abdominal:     General: Bowel sounds are normal.     Palpations: Abdomen is soft.     Tenderness: There is no abdominal tenderness.  Musculoskeletal:        General: No swelling.     Cervical back: Neck supple.  Lymphadenopathy:     Head:     Right side of head: No submental, submandibular, tonsillar, preauricular or posterior auricular adenopathy.     Left side of head: No submental, submandibular, tonsillar, preauricular or posterior auricular adenopathy.     Cervical: No cervical adenopathy.     Right cervical: No superficial cervical adenopathy.    Left cervical: No superficial cervical adenopathy.  Skin:    General: Skin is warm and dry.     Capillary Refill: Capillary refill takes less than 2 seconds.     Findings: No rash.  Neurological:     Mental Status: He is alert and oriented to person, place, and time.  Psychiatric:        Mood and Affect: Mood normal.      UC Treatments / Results  Labs (all labs ordered are listed, but only abnormal results are displayed) Labs Reviewed - No data to  display  EKG   Radiology No results found.  Procedures Procedures (including critical care time)  Medications Ordered in UC Medications  triamcinolone acetonide (KENALOG-40) injection 40 mg (has no administration in time range)  cefTRIAXone (ROCEPHIN) injection 1 g (has no administration in time range)    Initial Impression / Assessment and Plan / UC Course  I have reviewed the triage vital signs and the nursing notes.  Pertinent labs & imaging results that were available during my care of the patient were reviewed by me and considered in my medical decision making (see chart for details).  Chest x-ray appears to show pneumonia.  Will treat for community-acquired pneumonia.  Patient is aware that radiology will review the film and he will be contacted if there report differs from ours.  Rocephin, 1 g, IM now.  Kenalog, 40 mg, IM now.  Clarithromycin, 500 mg, twice daily for 7 days.  Continue using albuterol inhaler as previously prescribed.  Continue using cough syrup, as needed.  Get plenty of fluids and rest.  Patient has an appointment on 03/02/2023 to see pulmonology.  Keep that appointment and get good follow-up.  Follow-up here if symptoms do not improve, worsen or new symptoms occur.  At the end of his visit after the AVS and printed, the patient asked for a prescription cough syrup.  Provided with Promethazine DM, 5 mL, every 6 hours, as needed for cough.  We discussed this verbally. Final Clinical Impressions(s) / UC Diagnoses   Final diagnoses:  Acute cough  Community acquired pneumonia, unspecified laterality     Discharge Instructions  Chest x-ray appears to show a pneumonia.  He has inhalers and cough syrup at home.  Will treat with Rocephin, 1000 mg, injection today here in the office.  Kenalog, 40 mg, injection here in the office today.  Clarithromycin, 500 mg, 1 twice daily for 7 days.  Patient has an appointment with pulmonology in [place] on 03/02/2023.   Follow-up with pulmonology as planned.  Return here if symptoms do not improve, worsen or new symptoms occur before he can get to pulmonology.     ED Prescriptions     Medication Sig Dispense Auth. Provider   clarithromycin (BIAXIN) 500 MG tablet Take 1 tablet (500 mg total) by mouth 2 (two) times daily for 7 days. 14 tablet Prescilla Sours, FNP      PDMP not reviewed this encounter.   Prescilla Sours, FNP 02/17/23 1804    Prescilla Sours, FNP 02/17/23 Rickey Primus

## 2023-02-17 NOTE — Progress Notes (Signed)
Chest x-ray does not show pneumonia.  There are some mild coarsening of the pulmonary markings that were noted in 2021.  This is most likely secondary to interstitial lung disease or scarring.  Given his history and symptoms, I would recommend him to complete the antibiotics.  But he also needs to follow-up with pulmonology as planned.

## 2023-02-17 NOTE — Discharge Instructions (Addendum)
Chest x-ray appears to show a pneumonia.  He has inhalers and cough syrup at home.  Will treat with Rocephin, 1000 mg, injection today here in the office.  Kenalog, 40 mg, injection here in the office today.  Clarithromycin, 500 mg, 1 twice daily for 7 days.  Patient has an appointment with pulmonology in [place] on 03/02/2023.  Follow-up with pulmonology as planned.  Return here if symptoms do not improve, worsen or new symptoms occur before he can get to pulmonology.

## 2023-02-18 ENCOUNTER — Other Ambulatory Visit (HOSPITAL_BASED_OUTPATIENT_CLINIC_OR_DEPARTMENT_OTHER): Payer: Self-pay

## 2023-03-02 ENCOUNTER — Institutional Professional Consult (permissible substitution): Payer: Medicare Other | Admitting: Pulmonary Disease

## 2023-06-24 ENCOUNTER — Encounter (HOSPITAL_BASED_OUTPATIENT_CLINIC_OR_DEPARTMENT_OTHER): Payer: Self-pay

## 2023-06-24 ENCOUNTER — Ambulatory Visit (HOSPITAL_BASED_OUTPATIENT_CLINIC_OR_DEPARTMENT_OTHER)
Admission: RE | Admit: 2023-06-24 | Discharge: 2023-06-24 | Disposition: A | Discharge status: Home / Self Care | Payer: Self-pay | Source: Ambulatory Visit | Attending: Family Medicine | Admitting: Family Medicine

## 2023-06-24 VITALS — BP 124/85 | HR 64 | Temp 98.8°F | Resp 18

## 2023-06-24 DIAGNOSIS — R21 Rash and other nonspecific skin eruption: Secondary | ICD-10-CM | POA: Diagnosis not present

## 2023-06-24 DIAGNOSIS — A770 Spotted fever due to Rickettsia rickettsii: Secondary | ICD-10-CM | POA: Diagnosis not present

## 2023-06-24 MED ORDER — DOXYCYCLINE HYCLATE 100 MG PO CAPS: 100.0000 mg | ORAL_CAPSULE | Freq: Two times a day (BID) | ORAL | 0 refills | Status: AC

## 2023-06-24 NOTE — Discharge Instructions (Signed)
 Per pruritic rash and possible [facility] spotted fever: Blood work is pending.  Getting a [facility] spotted fever and Lyme titer but also a CBC with differential and a chemistry panel.  Will adjust the plan of care, if needed once the labs result.  Patient advised to complete the antibiotic no matter what his lab results.  There are certainly many causes of purpura but this appears to be [facility] spotted fever and until we prove different he should complete the antibiotics.  Reviewed signs and symptoms of worsening condition and reasons to return here including but not limited to fevers and bodyaches.  Work excuse declined.  Get plenty of fluids and rest.  Follow-up if symptoms do not improve, worsen or new symptoms occur.

## 2023-06-24 NOTE — ED Triage Notes (Signed)
 Pt reports red spots from below knee to top of feet on both legs first noticed them 2 days ago. Pt has had them come up before but they went away before.

## 2023-06-24 NOTE — ED Provider Notes (Signed)
 [facility]    CSN: [id number] Arrival date & time: 06/24/23  1630      History   Chief Complaint Chief Complaint  Patient presents with   Rash    Red spots on lower legs and ankles - Entered by patient    HPI Jacob Page. is a 68 y.o. male.   Patient has a spotted rash on his lower legs that occurred on 06/22/2023.  He woke up that morning and he noticed the spots.  Started out with a few spots but it was pretty well spread during the day that day.  It goes from the tips of his toes to his knees.  It stops at the knees.  It is all around the lower leg.  There is a band at his ankles where his sock is tight that is clear.  There is no specific pattern but it is widely disseminated.  Denies any tick bites.  Reports he had [facility] spotted fever when he was 12 but he had fever and bodyaches along with the rash.  Reports that he had a similar rash but only on a very small part of both lower legs 1 to 2 months ago and that rash went away without any treatment.  He is not on any new medications and is not aware of any infections or new medical problems.  He denies fever, body aches, insect or tick bites, nausea, vomiting, constipation, diarrhea.   Rash Associated symptoms: no abdominal pain, no diarrhea, no fever, no joint pain, no nausea, no sore throat and not vomiting     Past Medical History:  Diagnosis Date   History of kidney stones    "I had kidney stones around 2002-Apr-2004, but they passed on their own"   Pneumonia    "diagnosed maybe 10 years ago"   Sleep apnea    "did a sleep study where you stay overnight about 35 years ago in [place], but I don't wear a CPAP because I feel fine"    Patient Active Problem List   Diagnosis Date Noted   Pneumonia due to COVID-19 virus 08/28/2019   S/P hernia repair 04/26/2019    Past Surgical History:  Procedure Laterality Date   DENTAL SURGERY  2019   Per pt "I have implants on the upper front teeth, they don't  come out. They gave me gas for the procedure"   EXPLORATORY LAPAROTOMY  04/26/2019   HEMORROIDECTOMY     INCISIONAL HERNIA REPAIR  04/26/2019   WITH MESH   INCISIONAL HERNIA REPAIR N/A 04/26/2019   Procedure: INCISIONAL HERNIA REPAIR WITH MESH (TAR);  Surgeon: Shela Derby, MD;  Location: [facility] OR;  Service: General;  Laterality: N/A;   INSERTION OF MESH N/A 04/26/2019   Procedure: Insertion Of Mesh;  Surgeon: Shela Derby, MD;  Location: [facility] OR;  Service: General;  Laterality: N/A;   LAPAROTOMY N/A 04/26/2019   Procedure: EXPLORATORY LAPAROTOMY;  Surgeon: Shela Derby, MD;  Location: [facility] OR;  Service: General;  Laterality: N/A;   LYSIS OF ADHESION N/A 04/26/2019   Procedure: LYSIS OF ADHESION;  Surgeon: Shela Derby, MD;  Location: [facility] OR;  Service: General;  Laterality: N/A;       Home Medications    Prior to Admission medications   Medication Sig Start Date End Date Taking? Authorizing Provider  doxycycline (VIBRAMYCIN) 100 MG capsule Take 1 capsule (100 mg total) by mouth 2 (two) times daily after a meal for 14 days. 06/24/23 07/08/23 Yes Arisha Gervais,  Isa Manuel, FNP  albuterol  (PROVENTIL ) (2.5 MG/3ML) 0.083% nebulizer solution Take 3 mLs (2.5 mg total) by nebulization every 6 (six) hours as needed for wheezing or shortness of breath. 02/05/23   Harlow Lighter, [place]  N, FNP  albuterol  (VENTOLIN  HFA) 108 (90 Base) MCG/ACT inhaler Inhale 1-2 puffs into the lungs every 6 (six) hours as needed for wheezing or shortness of breath. 02/05/23   Harlow Lighter, [place]  N, FNP  promethazine -dextromethorphan (PROMETHAZINE -DM) 6.25-15 MG/5ML syrup Take 5 mLs by mouth 4 (four) times daily as needed. 02/17/23   Guss Legacy, FNP    Family History History reviewed. No pertinent family history.  Social History Social History   Tobacco Use   Smoking status: Former   Smokeless tobacco: Never   Tobacco comments:    pt quit smoking at age 26  Vaping Use   Vaping status: Never Used  Substance Use Topics   Alcohol  use: Not Currently   Drug use: Not Currently    Types: Other-see comments    Comment: CBD oil      Allergies   Patient has no known allergies.   Review of Systems Review of Systems  Constitutional:  Negative for chills and fever.  HENT:  Negative for ear pain and sore throat.   Eyes:  Negative for pain and visual disturbance.  Respiratory:  Negative for cough.   Cardiovascular:  Negative for chest pain and palpitations.  Gastrointestinal:  Negative for abdominal pain, constipation, diarrhea, nausea and vomiting.  Genitourinary:  Negative for dysuria and hematuria.  Musculoskeletal:  Negative for arthralgias and back pain.  Skin:  Positive for rash. Negative for color change.  Neurological:  Negative for seizures and syncope.  All other systems reviewed and are negative.    Physical Exam Triage Vital Signs ED Triage Vitals  Encounter Vitals Group     BP 06/24/23 1644 124/85     Systolic BP Percentile --      Diastolic BP Percentile --      Pulse Rate 06/24/23 1644 64     Resp 06/24/23 1644 18     Temp 06/24/23 1644 98.8 F (37.1 C)     Temp Source 06/24/23 1644 Oral     SpO2 06/24/23 1644 93 %     Weight --      Height --      Head Circumference --      Peak Flow --      Pain Score 06/24/23 1643 0     Pain Loc --      Pain Education --      Exclude from Growth Chart --    No data found.  Updated Vital Signs BP 124/85 (BP Location: Right Arm)   Pulse 64   Temp 98.8 F (37.1 C) (Oral)   Resp 18   SpO2 93%   Visual Acuity Right Eye Distance:   Left Eye Distance:   Bilateral Distance:    Right Eye Near:   Left Eye Near:    Bilateral Near:     Physical Exam Vitals and nursing note reviewed.  Constitutional:      General: He is not in acute distress.    Appearance: He is well-developed. He is not ill-appearing or toxic-appearing.  HENT:     Head: Normocephalic and atraumatic.     Right Ear: Hearing, tympanic membrane, ear canal and external ear  normal.     Left Ear: Hearing, tympanic membrane, ear canal and external ear normal.     Nose: No congestion  or rhinorrhea.     Right Sinus: No maxillary sinus tenderness or frontal sinus tenderness.     Left Sinus: No maxillary sinus tenderness or frontal sinus tenderness.     Mouth/Throat:     Lips: Pink.     Mouth: Mucous membranes are moist.     Pharynx: Uvula midline. No oropharyngeal exudate or posterior oropharyngeal erythema.     Tonsils: No tonsillar exudate.  Eyes:     Conjunctiva/sclera: Conjunctivae normal.     Pupils: Pupils are equal, round, and reactive to light.  Cardiovascular:     Rate and Rhythm: Normal rate and regular rhythm.     Heart sounds: S1 normal and S2 normal. No murmur heard. Pulmonary:     Effort: Pulmonary effort is normal. No respiratory distress.     Breath sounds: Normal breath sounds. No decreased breath sounds, wheezing, rhonchi or rales.  Abdominal:     General: Bowel sounds are normal.     Palpations: Abdomen is soft.     Tenderness: There is no abdominal tenderness.  Musculoskeletal:        General: No swelling.     Cervical back: Neck supple.  Lymphadenopathy:     Head:     Right side of head: No submental, submandibular, tonsillar, preauricular or posterior auricular adenopathy.     Left side of head: No submental, submandibular, tonsillar, preauricular or posterior auricular adenopathy.     Cervical: No cervical adenopathy.     Right cervical: No superficial cervical adenopathy.    Left cervical: No superficial cervical adenopathy.  Skin:    General: Skin is warm and dry.     Capillary Refill: Capillary refill takes less than 2 seconds.     Findings: Rash (For pruritic rash on both lower legs from below the knees to the toe tips bilaterally.  The rash is circumferential on the lower legs.  The rash is widely disseminated.  See photos for more information.) present.  Neurological:     Mental Status: He is alert and oriented to person,  place, and time.  Psychiatric:        Mood and Affect: Mood normal.      UC Treatments / Results  Labs (all labs ordered are listed, but only abnormal results are displayed) Labs Reviewed  SPOTTED FEVER GROUP ANTIBODIES  LYME DISEASE SEROLOGY W/REFLEX  CBC WITH DIFFERENTIAL/PLATELET  COMPREHENSIVE METABOLIC PANEL WITH GFR    EKG   Radiology No results found.  Procedures Procedures (including critical care time)  Medications Ordered in UC Medications - No data to display  Initial Impression / Assessment and Plan / UC Course  I have reviewed the triage vital signs and the nursing notes.  Pertinent labs & imaging results that were available during my care of the patient were reviewed by me and considered in my medical decision making (see chart for details).  Plan of Care: Paraparetic rash and possible [facility] spotted fever: Lab work is pending.  Doxycycline 100 mg twice daily for 14 days.  This is [facility] spotted fever until we prove differently.  Patient denies any tick bites.  Declined body exam.  He did agree that tonight he would have his wife helped him in the bathroom and he would get a full exam of his skin by his wife so that if there is a tick somewhere he is not aware of they can get it off.  Encouraged probiotics while on the doxycycline.  Follow-up if symptoms do not  improve, worsen or new symptoms occur.  Will adjust the plan of care, as needed once the labs result.  I reviewed the plan of care with the patient and/or the patient's guardian.  The patient and/or guardian had time to ask questions and acknowledged that the questions were answered.  I provided instruction on symptoms or reasons to return here or to go to an ER, if symptoms/condition did not improve, worsened or if new symptoms occurred.  Final Clinical Impressions(s) / UC Diagnoses   Final diagnoses:  Acute purpuric eruption  RMSF [facility] spotted fever)     Discharge  Instructions      Per pruritic rash and possible [facility] spotted fever: Blood work is pending.  Getting a [facility] spotted fever and Lyme titer but also a CBC with differential and a chemistry panel.  Will adjust the plan of care, if needed once the labs result.  Patient advised to complete the antibiotic no matter what his lab results.  There are certainly many causes of purpura but this appears to be [facility] spotted fever and until we prove different he should complete the antibiotics.  Reviewed signs and symptoms of worsening condition and reasons to return here including but not limited to fevers and bodyaches.  Work excuse declined.  Get plenty of fluids and rest.  Follow-up if symptoms do not improve, worsen or new symptoms occur.   ED Prescriptions     Medication Sig Dispense Auth. Provider   doxycycline (VIBRAMYCIN) 100 MG capsule Take 1 capsule (100 mg total) by mouth 2 (two) times daily after a meal for 14 days. 28 capsule Guss Legacy, FNP      PDMP not reviewed this encounter.   Guss Legacy, FNP 06/24/23 [phone]

## 2023-06-28 LAB — CBC WITH DIFFERENTIAL/PLATELET
Basophils Absolute: 0.1 10*3/uL (ref 0.0–0.2)
Basos: 1 %
EOS (ABSOLUTE): 0.3 10*3/uL (ref 0.0–0.4)
Eos: 3 %
Hematocrit: 46.5 % (ref 37.5–51.0)
Hemoglobin: 15.3 g/dL (ref 13.0–17.7)
Immature Grans (Abs): 0.1 10*3/uL (ref 0.0–0.1)
Immature Granulocytes: 1 %
Lymphocytes Absolute: 2 10*3/uL (ref 0.7–3.1)
Lymphs: 22 %
MCH: 29.4 pg (ref 26.6–33.0)
MCHC: 32.9 g/dL (ref 31.5–35.7)
MCV: 89 fL (ref 79–97)
Monocytes Absolute: 0.9 10*3/uL (ref 0.1–0.9)
Monocytes: 9 %
Neutrophils Absolute: 6.2 10*3/uL (ref 1.4–7.0)
Neutrophils: 64 %
Platelets: 276 10*3/uL (ref 150–450)
RBC: 5.2 x10E6/uL (ref 4.14–5.80)
RDW: 12.5 % (ref 11.6–15.4)
WBC: 9.5 10*3/uL (ref 3.4–10.8)

## 2023-06-28 LAB — COMPREHENSIVE METABOLIC PANEL WITH GFR
ALT: 22 IU/L (ref 0–44)
AST: 21 IU/L (ref 0–40)
Albumin: 4.1 g/dL (ref 3.9–4.9)
Alkaline Phosphatase: 77 IU/L (ref 44–121)
BUN/Creatinine Ratio: 20 (ref 10–24)
BUN: 17 mg/dL (ref 8–27)
Bilirubin Total: 0.9 mg/dL (ref 0.0–1.2)
CO2: 22 mmol/L (ref 20–29)
Calcium: 9.2 mg/dL (ref 8.6–10.2)
Chloride: 106 mmol/L (ref 96–106)
Creatinine, Ser: 0.83 mg/dL (ref 0.76–1.27)
Globulin, Total: 3 g/dL (ref 1.5–4.5)
Glucose: 81 mg/dL (ref 70–99)
Potassium: 4.1 mmol/L (ref 3.5–5.2)
Sodium: 143 mmol/L (ref 134–144)
Total Protein: 7.1 g/dL (ref 6.0–8.5)
eGFR: 96 mL/min/{1.73_m2} (ref >59)

## 2023-06-28 LAB — SPOTTED FEVER GROUP ANTIBODIES
Spotted Fever Group IgG: <1:64 {titer}
Spotted Fever Group IgM: <1:64 {titer}

## 2023-06-28 LAB — LYME DISEASE SEROLOGY W/REFLEX: Lyme Total Antibody EIA: NEGATIVE

## 2023-06-29 ENCOUNTER — Ambulatory Visit (HOSPITAL_BASED_OUTPATIENT_CLINIC_OR_DEPARTMENT_OTHER): Payer: Self-pay | Admitting: Family Medicine

## 2023-06-29 NOTE — Progress Notes (Signed)
 CBC with Diff, CMP, Lyme and RMSF were all negative.  The patient decided that he thinks it was some form of stasis dermatitis.  It has improved significantly and almost resolved with the antibiotic use.  He is also now using some support or compression socks.  He will see Dr. Clarnce Crow later this month and discuss options.

## 2023-07-09 ENCOUNTER — Ambulatory Visit (HOSPITAL_BASED_OUTPATIENT_CLINIC_OR_DEPARTMENT_OTHER): Admitting: Family Medicine

## 2023-07-09 ENCOUNTER — Encounter (HOSPITAL_BASED_OUTPATIENT_CLINIC_OR_DEPARTMENT_OTHER): Payer: Self-pay

## 2023-07-12 ENCOUNTER — Encounter (HOSPITAL_BASED_OUTPATIENT_CLINIC_OR_DEPARTMENT_OTHER): Payer: Self-pay | Admitting: Family Medicine

## 2023-07-12 ENCOUNTER — Ambulatory Visit (INDEPENDENT_AMBULATORY_CARE_PROVIDER_SITE_OTHER): Admitting: Family Medicine

## 2023-07-12 VITALS — BP 121/82 | HR 71 | Temp 98.4°F | Resp 16 | Ht 70.87 in | Wt 290.4 lb

## 2023-07-12 DIAGNOSIS — G4733 Obstructive sleep apnea (adult) (pediatric): Secondary | ICD-10-CM | POA: Insufficient documentation

## 2023-07-12 DIAGNOSIS — R233 Spontaneous ecchymoses: Secondary | ICD-10-CM

## 2023-07-12 DIAGNOSIS — I872 Venous insufficiency (chronic) (peripheral): Secondary | ICD-10-CM | POA: Diagnosis not present

## 2023-07-12 NOTE — Progress Notes (Unsigned)
 Established Patient Office Visit  Subjective   Patient ID: Jacob Page., male    DOB: 1955-07-22  Age: 68 y.o. MRN: [id number]  Chief Complaint  Patient presents with   Establish Care    Establish care   bilateral leg spots    Bilateral leg spots    F/u as above.  New to my practice.  Has developed red bumps across both legs with no known triggers.  No pain or itching.  His 1+ LE is fairly chronic.  No known blood loss elsewhere.  Has struggled with his weight for some time.    Past Medical History:  Diagnosis Date   History of kidney stones    I had kidney stones around 2003-21-2004, but they passed on their own   Morbid obesity (HCC)    OSA (obstructive sleep apnea)    Clinically mild.  Intolerant to CPAP.   Venous insufficiency of both lower extremities     Outpatient Encounter Medications as of 07/12/2023  Medication Sig   albuterol  (PROVENTIL ) (2.5 MG/3ML) 0.083% nebulizer solution Take 3 mLs (2.5 mg total) by nebulization every 6 (six) hours as needed for wheezing or shortness of breath.   albuterol  (VENTOLIN  HFA) 108 (90 Base) MCG/ACT inhaler Inhale 1-2 puffs into the lungs every 6 (six) hours as needed for wheezing or shortness of breath.   [DISCONTINUED] promethazine -dextromethorphan (PROMETHAZINE -DM) 6.25-15 MG/5ML syrup Take 5 mLs by mouth 4 (four) times daily as needed.   No facility-administered encounter medications on file as of 07/12/2023.    Social History   Tobacco Use   Smoking status: Former   Smokeless tobacco: Never   Tobacco comments:    pt quit smoking at age 75  Vaping Use   Vaping status: Never Used  Substance Use Topics   Alcohol use: Not Currently   Drug use: Not Currently    Types: Other-see comments    Comment: CBD oil     {History (Optional):23778}  Review of Systems  Constitutional:  Negative for diaphoresis, fever, malaise/fatigue and weight loss.  Respiratory:  Negative for cough, shortness of breath and wheezing.    Cardiovascular:  Negative for chest pain, palpitations, orthopnea, claudication, leg swelling and PND.  Gastrointestinal:  Negative for blood in stool.  Genitourinary:  Negative for hematuria.  Endo/Heme/Allergies:  Does not bruise/bleed easily.      Objective:     BP 121/82 (BP Location: Right Arm, Patient Position: Standing, Cuff Size: Normal)   Pulse 71   Temp 98.4 F (36.9 C) (Oral)   Resp 16   Ht 5' 10.87 (1.8 m)   Wt 290 lb 6.4 oz (131.7 kg)   SpO2 91%   BMI 40.66 kg/m  {Vitals History (Optional):23777}  Physical Exam Constitutional:      General: He is not in acute distress.    Appearance: Normal appearance. He is obese.  HENT:     Head: Normocephalic.  Neck:     Vascular: No carotid bruit.   Cardiovascular:     Rate and Rhythm: Normal rate and regular rhythm.     Pulses: Normal pulses.     Heart sounds: Normal heart sounds.  Pulmonary:     Effort: Pulmonary effort is normal.     Breath sounds: Normal breath sounds.  Abdominal:     General: Bowel sounds are normal.     Palpations: Abdomen is soft.   Musculoskeletal:     Cervical back: Neck supple. No tenderness.     Right lower  leg: Edema present.     Left lower leg: Edema present.     Comments: 1+ Bilateral LE edema   Skin:    Comments: Numerous Petechiae across both legs anteriorly   Neurological:     Mental Status: He is alert.      No results found for any visits on 07/12/23.  {Labs (Optional):23779}  The ASCVD Risk score (Arnett DK, et al., 2019) failed to calculate for the following reasons:   Cannot find a previous HDL lab   Cannot find a previous total cholesterol lab    Assessment & Plan:  Stasis dermatitis of both legs Assessment & Plan: Suspected.  However, a punch biopsy should be considered and I encouraged a dermatology referral.  He will discuss with his wife first and alert us  if interested.  Encouraged weight loss and sodium restriction.  Encouraged f/u for a  CPE.   Morbid obesity (HCC)  Petechiae -     Urinalysis, Routine w reflex microscopic  OSA (obstructive sleep apnea)    Return in about 4 weeks (around 08/09/2023) for physical.    DOTTIE PONCE NORLEEN FALCON., MD

## 2023-07-12 NOTE — Assessment & Plan Note (Addendum)
 Suspected.  However, a punch biopsy should be considered and I recommended a dermatology referral.  He will discuss with his wife first and alert us  if interested.  Encouraged weight loss and sodium restriction.  Encouraged f/u for a CPE.

## 2023-07-13 LAB — URINALYSIS, ROUTINE W REFLEX MICROSCOPIC
Bilirubin, UA: NEGATIVE
Glucose, UA: NEGATIVE
Ketones, UA: NEGATIVE
Leukocytes,UA: NEGATIVE
Nitrite, UA: NEGATIVE
Protein,UA: NEGATIVE
RBC, UA: NEGATIVE
Specific Gravity, UA: 1.026 (ref 1.005–1.030)
Urobilinogen, Ur: 0.2 mg/dL (ref 0.2–1.0)
pH, UA: 5.5 (ref 5.0–7.5)

## 2023-08-02 ENCOUNTER — Telehealth (HOSPITAL_BASED_OUTPATIENT_CLINIC_OR_DEPARTMENT_OTHER): Payer: Self-pay

## 2023-08-02 NOTE — Telephone Encounter (Signed)
 Copied from CRM [id number]. Topic: Appointments - Scheduling Inquiry for Clinic >> Aug 02, 2023 10:18 AM Jacob Page wrote: Reason for CRM: Patient scheduled for a physical on 08/16/23 and needs to reschedule. Unable to assist as tries to schedule AWV and patient wants it to be a physical.  Patient can be reached at [phone]

## 2023-08-16 ENCOUNTER — Encounter (HOSPITAL_BASED_OUTPATIENT_CLINIC_OR_DEPARTMENT_OTHER): Admitting: Family Medicine

## 2023-08-20 ENCOUNTER — Encounter (HOSPITAL_BASED_OUTPATIENT_CLINIC_OR_DEPARTMENT_OTHER): Payer: Self-pay | Admitting: Family Medicine

## 2023-08-20 ENCOUNTER — Ambulatory Visit (INDEPENDENT_AMBULATORY_CARE_PROVIDER_SITE_OTHER): Admitting: Family Medicine

## 2023-08-20 VITALS — BP 125/85 | HR 66 | Temp 98.9°F | Resp 16 | Ht 73.0 in | Wt 282.7 lb

## 2023-08-20 DIAGNOSIS — K117 Disturbances of salivary secretion: Secondary | ICD-10-CM

## 2023-08-20 DIAGNOSIS — R233 Spontaneous ecchymoses: Secondary | ICD-10-CM

## 2023-08-20 NOTE — Progress Notes (Signed)
 Established Patient Office Visit  Subjective   Patient ID: Jacob Page., male    DOB: [birth year]  Age: 68 y.o. MRN: [id number]  Chief Complaint  Patient presents with   Annual Exam    Annual exam    dry mouth     Dry mouth     F/u as above.  He has limited interest in a CPE today and we'll just focus on his primary concerns.  He is a bit frustrated with dry mouth.  No dry eyes.  Few other acute concerns.  Ongoing struggles with weight loss.  He isn't particularly interest in weight loss meds, etc.  His leg rash persists as addressed in his last note.    Past Medical History:  Diagnosis Date   Asthma    Colonoscopy refused    with Cologuard urged   History of kidney stones    Morbid obesity (HCC)    OSA (obstructive sleep apnea)    Clinically mild.  Intolerant to CPAP.   Venous insufficiency of both lower extremities     Outpatient Encounter Medications as of 08/20/2023  Medication Sig   albuterol  (PROVENTIL ) (2.5 MG/3ML) 0.083% nebulizer solution Take 3 mLs (2.5 mg total) by nebulization every 6 (six) hours as needed for wheezing or shortness of breath.   albuterol  (VENTOLIN  HFA) 108 (90 Base) MCG/ACT inhaler Inhale 1-2 puffs into the lungs every 6 (six) hours as needed for wheezing or shortness of breath.   No facility-administered encounter medications on file as of 08/20/2023.    Social History   Tobacco Use   Smoking status: Former   Smokeless tobacco: Never   Tobacco comments:    pt quit smoking at age 33  Vaping Use   Vaping status: Never Used  Substance Use Topics   Alcohol use: Not Currently   Drug use: Not Currently    Types: Other-see comments    Comment: CBD oil       Review of Systems  Constitutional:  Negative for diaphoresis, fever, malaise/fatigue and weight loss.  Respiratory:  Negative for cough, shortness of breath and wheezing.   Cardiovascular:  Negative for chest pain, palpitations, orthopnea, claudication, leg swelling and PND.       Objective:     BP 125/85 (BP Location: Right Arm, Patient Position: Standing, Cuff Size: Normal)   Pulse 66   Temp 98.9 F (37.2 C) (Oral)   Resp 16   Ht 6' 1 (1.854 m)   Wt 282 lb 11.2 oz (128.2 kg)   SpO2 93%   BMI 37.30 kg/m    Physical Exam Constitutional:      General: He is not in acute distress.    Appearance: Normal appearance. He is obese.  HENT:     Head: Normocephalic.  Neck:     Vascular: No carotid bruit.  Cardiovascular:     Rate and Rhythm: Normal rate and regular rhythm.     Pulses: Normal pulses.     Heart sounds: Normal heart sounds.  Pulmonary:     Effort: Pulmonary effort is normal.     Breath sounds: Normal breath sounds.  Abdominal:     General: Bowel sounds are normal.     Palpations: Abdomen is soft. There is no mass.     Tenderness: There is no abdominal tenderness.  Musculoskeletal:     Cervical back: Neck supple. No tenderness.     Right lower leg: Edema present.     Left lower leg: Edema present.  Skin:    Comments: Numerous petechia on his legs noted bilaterally.  Neurological:     Mental Status: He is alert.      No results found for any visits on 08/20/23.    The ASCVD Risk score (Arnett DK, et al., 2019) failed to calculate for the following reasons:   Cannot find a previous HDL lab   Cannot find a previous total cholesterol lab    Assessment & Plan:  Petechiae Assessment & Plan: Unclear etiology.  He is now agreeable to dermatology referral.  I encouraged a FLP and PSA if he is agreeable.  Orders: -     Ambulatory referral to Dermatology  Xerostomia  Morbid obesity (HCC)  I personally spent a total of 30 minutes in the care of the patient today including performing a medically appropriate exam/evaluation, counseling and educating, documenting clinical information in the EHR, and communicating results.   No follow-ups on file.    REDDING PONCE NORLEEN FALCON., MD

## 2023-08-23 ENCOUNTER — Encounter (HOSPITAL_BASED_OUTPATIENT_CLINIC_OR_DEPARTMENT_OTHER): Payer: Self-pay | Admitting: Family Medicine

## 2023-08-23 DIAGNOSIS — Z0000 Encounter for general adult medical examination without abnormal findings: Secondary | ICD-10-CM | POA: Insufficient documentation

## 2023-08-23 NOTE — Assessment & Plan Note (Signed)
 Unclear etiology.  He is now agreeable to dermatology referral.  I encouraged a FLP and PSA if he is agreeable.

## 2023-08-23 NOTE — Assessment & Plan Note (Signed)
 Work harder on weight loss, though his interest is limited.  He is now agreeable to dermatology referral for his unusual petechia along his legs.  I suggest a biopsy may be needed.  Monitor his Xerostomia, though I'm not very concerned about Sjogren's yet.

## 2023-12-02 ENCOUNTER — Encounter (HOSPITAL_BASED_OUTPATIENT_CLINIC_OR_DEPARTMENT_OTHER): Payer: Self-pay

## 2023-12-05 ENCOUNTER — Encounter (HOSPITAL_BASED_OUTPATIENT_CLINIC_OR_DEPARTMENT_OTHER): Payer: Self-pay | Admitting: Emergency Medicine

## 2023-12-05 ENCOUNTER — Ambulatory Visit (INDEPENDENT_AMBULATORY_CARE_PROVIDER_SITE_OTHER)
Admit: 2023-12-05 | Discharge: 2023-12-05 | Disposition: A | Discharge status: Home / Self Care | Attending: Physician Assistant | Admitting: Physician Assistant

## 2023-12-05 ENCOUNTER — Ambulatory Visit (HOSPITAL_BASED_OUTPATIENT_CLINIC_OR_DEPARTMENT_OTHER)
Admission: EM | Admit: 2023-12-05 | Discharge: 2023-12-05 | Disposition: A | Discharge status: Home / Self Care | Attending: Physician Assistant | Admitting: Physician Assistant

## 2023-12-05 DIAGNOSIS — R0789 Other chest pain: Secondary | ICD-10-CM

## 2023-12-05 MED ORDER — CYCLOBENZAPRINE HCL 10 MG PO TABS: 10.0000 mg | ORAL_TABLET | Freq: Two times a day (BID) | ORAL | 0 refills | Status: DC | PRN

## 2023-12-05 NOTE — ED Triage Notes (Signed)
 Pt c/o right flank pain that radiates up to his shoulder started last week. Pt is unable to take a deep breathe due to the pain.

## 2023-12-05 NOTE — ED Provider Notes (Signed)
 [facility]    CSN: [id number] Arrival date & time: 12/05/23  1226      History   Chief Complaint Chief Complaint  Patient presents with   Flank Pain   Shoulder Pain    HPI Jacob Page. is a 68 y.o. male.   Patient complains of right lateral rib pain that radiates up to his shoulder that started last week.  Denies injury or trauma.  Reports pain is worse with deep breathing and movement.  Denies chest pain, shortness of breath.    Past Medical History:  Diagnosis Date   Asthma    Colonoscopy refused    with Cologuard urged   History of kidney stones    Morbid obesity (HCC)    OSA (obstructive sleep apnea)    Clinically mild.  Intolerant to CPAP.   Venous insufficiency of both lower extremities     Patient Active Problem List   Diagnosis Date Noted   Wellness examination 08/23/2023   Petechiae 08/20/2023   Xerostomia 08/20/2023   Stasis dermatitis of both legs 07/12/2023   Morbid obesity (HCC) 07/12/2023   OSA (obstructive sleep apnea) 07/12/2023   Abnormal EKG 11/13/2022   Precordial chest pain 11/13/2022    Past Surgical History:  Procedure Laterality Date   DENTAL SURGERY  2019   Per pt I have implants on the upper front teeth, they don't come out. They gave me gas for the procedure   EXPLORATORY LAPAROTOMY  04/26/2019   HEMORROIDECTOMY     INCISIONAL HERNIA REPAIR  04/26/2019   WITH MESH   INCISIONAL HERNIA REPAIR N/A 04/26/2019   Procedure: INCISIONAL HERNIA REPAIR WITH MESH (TAR);  Surgeon: Rubin Calamity, MD;  Location: [facility] OR;  Service: General;  Laterality: N/A;   INSERTION OF MESH N/A 04/26/2019   Procedure: Insertion Of Mesh;  Surgeon: Rubin Calamity, MD;  Location: [facility] OR;  Service: General;  Laterality: N/A;   LAPAROTOMY N/A 04/26/2019   Procedure: EXPLORATORY LAPAROTOMY;  Surgeon: Rubin Calamity, MD;  Location: [facility] OR;  Service: General;  Laterality: N/A;   LYSIS OF ADHESION N/A 04/26/2019   Procedure: LYSIS OF ADHESION;   Surgeon: Rubin Calamity, MD;  Location: [facility] OR;  Service: General;  Laterality: N/A;       Home Medications    Prior to Admission medications   Medication Sig Start Date End Date Taking? Authorizing Provider  cyclobenzaprine (FLEXERIL) 10 MG tablet Take 1 tablet (10 mg total) by mouth 2 (two) times daily as needed for muscle spasms. 12/05/23  Yes Ward, Harlene PEDLAR, PA-C  albuterol  (PROVENTIL ) (2.5 MG/3ML) 0.083% nebulizer solution Take 3 mLs (2.5 mg total) by nebulization every 6 (six) hours as needed for wheezing or shortness of breath. 02/05/23   Dreama, [place]  N, FNP  albuterol  (VENTOLIN  HFA) 108 (90 Base) MCG/ACT inhaler Inhale 1-2 puffs into the lungs every 6 (six) hours as needed for wheezing or shortness of breath. 02/05/23   Dreama Jacqulin SAILOR, FNP    Family History History reviewed. No pertinent family history.  Social History Social History   Tobacco Use   Smoking status: Former   Smokeless tobacco: Never   Tobacco comments:    pt quit smoking at age 29  Vaping Use   Vaping status: Never Used  Substance Use Topics   Alcohol use: Not Currently   Drug use: Not Currently    Types: Other-see comments    Comment: CBD oil      Allergies   Patient has no  known allergies.   Review of Systems Review of Systems  Constitutional:  Negative for chills and fever.  HENT:  Negative for ear pain and sore throat.   Eyes:  Negative for pain and visual disturbance.  Respiratory:  Negative for cough and shortness of breath.   Cardiovascular:  Negative for chest pain and palpitations.  Gastrointestinal:  Negative for abdominal pain and vomiting.  Genitourinary:  Negative for dysuria and hematuria.  Musculoskeletal:  Negative for arthralgias and back pain.  Skin:  Negative for color change and rash.  Neurological:  Negative for seizures and syncope.  All other systems reviewed and are negative.    Physical Exam Triage Vital Signs ED Triage Vitals  Encounter Vitals Group      BP 12/05/23 1250 127/81     Girls Systolic BP Percentile --      Girls Diastolic BP Percentile --      Boys Systolic BP Percentile --      Boys Diastolic BP Percentile --      Pulse Rate 12/05/23 1250 63     Resp 12/05/23 1250 18     Temp 12/05/23 1250 98.1 F (36.7 C)     Temp Source 12/05/23 1250 Oral     SpO2 12/05/23 1250 94 %     Weight --      Height --      Head Circumference --      Peak Flow --      Pain Score 12/05/23 1249 8     Pain Loc --      Pain Education --      Exclude from Growth Chart --    No data found.  Updated Vital Signs BP 127/81 (BP Location: Left Arm)   Pulse 63   Temp 98.1 F (36.7 C) (Oral)   Resp 18   SpO2 94%   Visual Acuity Right Eye Distance:   Left Eye Distance:   Bilateral Distance:    Right Eye Near:   Left Eye Near:    Bilateral Near:     Physical Exam Vitals and nursing note reviewed.  Constitutional:      General: He is not in acute distress.    Appearance: He is well-developed.  HENT:     Head: Normocephalic and atraumatic.  Eyes:     Conjunctiva/sclera: Conjunctivae normal.  Cardiovascular:     Rate and Rhythm: Normal rate and regular rhythm.     Heart sounds: No murmur heard. Pulmonary:     Effort: Pulmonary effort is normal. No respiratory distress.     Breath sounds: Normal breath sounds.  Chest:     Comments: Lateral rib tenderness to palpation on exam. Abdominal:     Palpations: Abdomen is soft.     Tenderness: There is no abdominal tenderness.  Musculoskeletal:        General: No swelling.     Cervical back: Neck supple.  Skin:    General: Skin is warm and dry.     Capillary Refill: Capillary refill takes less than 2 seconds.  Neurological:     Mental Status: He is alert.  Psychiatric:        Mood and Affect: Mood normal.      UC Treatments / Results  Labs (all labs ordered are listed, but only abnormal results are displayed) Labs Reviewed - No data to display  EKG   Radiology DG  Chest 2 View Result Date: 12/05/2023 CLINICAL DATA:  Right flank pain and rib pain.  EXAM: CHEST - 2 VIEW COMPARISON:  02/17/2023 FINDINGS: Lungs are adequately inflated with mild stable bilateral interstitial prominence. No acute lobar consolidation or effusion. Cardiomediastinal silhouette and remainder of the exam is unchanged. IMPRESSION: No acute cardiopulmonary disease. Electronically Signed   By: Toribio Agreste M.D.   On: 12/05/2023 14:06    Procedures Procedures (including critical care time)  Medications Ordered in UC Medications - No data to display  Initial Impression / Assessment and Plan / UC Course  I have reviewed the triage vital signs and the nursing notes.  Pertinent labs & imaging results that were available during my care of the patient were reviewed by me and considered in my medical decision making (see chart for details).    Right lateral rib pain.  Pain worse with movement and with palpation.  Supportive care discussed.  Will send in Flexeril to take as needed.  Patient has upcoming appointment with PCP.  Advised to review with PCP if no improvement.  Final diagnoses:  Rib pain     Discharge Instructions      Can take muscle relaxer as needed. Continue to take deep breaths to prevent air trapping, or pneumonia. Your chest x-ray is normal today. Keep upcoming appoint with your primary care physician discussed with him if no improvement.   ED Prescriptions     Medication Sig Dispense Auth. Provider   cyclobenzaprine (FLEXERIL) 10 MG tablet Take 1 tablet (10 mg total) by mouth 2 (two) times daily as needed for muscle spasms. 20 tablet Ward, Janeka Libman Z, PA-C      PDMP not reviewed this encounter.   Ward, Harlene PEDLAR, PA-C 12/05/23 [phone]

## 2023-12-05 NOTE — Discharge Instructions (Signed)
 Can take muscle relaxer as needed. Continue to take deep breaths to prevent air trapping, or pneumonia. Your chest x-ray is normal today. Keep upcoming appoint with your primary care physician discussed with him if no improvement.

## 2023-12-08 ENCOUNTER — Encounter (HOSPITAL_BASED_OUTPATIENT_CLINIC_OR_DEPARTMENT_OTHER): Payer: Self-pay

## 2023-12-08 ENCOUNTER — Encounter (HOSPITAL_BASED_OUTPATIENT_CLINIC_OR_DEPARTMENT_OTHER)

## 2023-12-08 DIAGNOSIS — Z0000 Encounter for general adult medical examination without abnormal findings: Secondary | ICD-10-CM

## 2023-12-08 NOTE — Progress Notes (Signed)
 This encounter was created in error - please disregard.

## 2023-12-09 ENCOUNTER — Telehealth (HOSPITAL_BASED_OUTPATIENT_CLINIC_OR_DEPARTMENT_OTHER): Payer: Self-pay | Admitting: Family Medicine

## 2023-12-09 NOTE — Telephone Encounter (Signed)
 Copied from CRM [id number]. Topic: General - Other >> Dec 08, 2023  5:05 PM Santiya F wrote: Reason for CRM: Patient is calling in because he had an AWV online today and was speaking with someone and got disconnected. Patient is calling back because he had missed calls from the office and says he is a little confused on what's going on. Reached out to [place] per message in the appointments, but didn't get a response. Please follow up with patient.

## 2023-12-10 ENCOUNTER — Telehealth (HOSPITAL_BASED_OUTPATIENT_CLINIC_OR_DEPARTMENT_OTHER): Payer: Self-pay

## 2023-12-10 NOTE — Telephone Encounter (Signed)
 We have reached out and lvm for patient to return call. Carlie can do on our clinic scheduled 11/26 any time 8am-1100am.

## 2023-12-10 NOTE — Telephone Encounter (Signed)
 Called patient and left a voicemail to call and schedule a clinical support appt for an AWV patient was unable to un mute his device and video visit did not get done. Can be scheduled as an in person visit or video visit it is up to patient preference.

## 2023-12-10 NOTE — Telephone Encounter (Signed)
 error

## 2023-12-10 NOTE — Telephone Encounter (Signed)
 Patient called and needs to be schedule as a video visit or in person visit for AWV depending on patient  for 11/26 from 8 to 11 am

## 2023-12-15 ENCOUNTER — Ambulatory Visit (INDEPENDENT_AMBULATORY_CARE_PROVIDER_SITE_OTHER)

## 2023-12-15 ENCOUNTER — Encounter (HOSPITAL_BASED_OUTPATIENT_CLINIC_OR_DEPARTMENT_OTHER): Payer: Self-pay

## 2023-12-15 VITALS — BP 127/83 | HR 63 | Temp 98.5°F | Resp 16 | Ht 73.0 in | Wt 262.8 lb

## 2023-12-15 DIAGNOSIS — Z0000 Encounter for general adult medical examination without abnormal findings: Secondary | ICD-10-CM

## 2023-12-15 NOTE — Progress Notes (Signed)
 Chief Complaint  Patient presents with   Medicare Wellness    Medicare Wellness.      Subjective:   Jacob V Fetting Jr. is a 68 y.o. male who presents for a Medicare Annual Wellness Visit.  Allergies (verified) Patient has no known allergies.   History: Past Medical History:  Diagnosis Date   Asthma    Colonoscopy refused    with Cologuard urged   History of kidney stones    Morbid obesity (HCC)    OSA (obstructive sleep apnea)    Clinically mild.  Intolerant to CPAP.   Venous insufficiency of both lower extremities    Past Surgical History:  Procedure Laterality Date   DENTAL SURGERY  2019   Per pt I have implants on the upper front teeth, they don't come out. They gave me gas for the procedure   EXPLORATORY LAPAROTOMY  04/26/2019   HEMORROIDECTOMY     INCISIONAL HERNIA REPAIR  04/26/2019   WITH MESH   INCISIONAL HERNIA REPAIR N/A 04/26/2019   Procedure: INCISIONAL HERNIA REPAIR WITH MESH (TAR);  Surgeon: Rubin Calamity, MD;  Location: [facility] OR;  Service: General;  Laterality: N/A;   INSERTION OF MESH N/A 04/26/2019   Procedure: Insertion Of Mesh;  Surgeon: Rubin Calamity, MD;  Location: [facility] OR;  Service: General;  Laterality: N/A;   LAPAROTOMY N/A 04/26/2019   Procedure: EXPLORATORY LAPAROTOMY;  Surgeon: Rubin Calamity, MD;  Location: [facility] OR;  Service: General;  Laterality: N/A;   LYSIS OF ADHESION N/A 04/26/2019   Procedure: LYSIS OF ADHESION;  Surgeon: Rubin Calamity, MD;  Location: [facility] OR;  Service: General;  Laterality: N/A;   Family History  Problem Relation Age of Onset   Diabetes Mother    Bone cancer Father        bone marrow cancer   Healthy Sister    Asthma Sister    Bronchitis Sister    Social History   Occupational History   Not on file  Tobacco Use   Smoking status: Former    Current packs/day: 0.00    Average packs/day: 1 pack/day for 1 year (1.0 ttl pk-yrs)    Types: Cigarettes    Start date: 66    Quit date: 01/18/1973    Years since  quitting: 50.9    Passive exposure: Past   Smokeless tobacco: Never   Tobacco comments:    pt quit smoking at age 11  Vaping Use   Vaping status: Never Used  Substance and Sexual Activity   Alcohol use: Not Currently   Drug use: Yes    Types: Other-see comments    Comment: CBD oil occasionally   Sexual activity: Not on file   Tobacco Counseling Counseling given: No Tobacco comments: pt quit smoking at age 34  SDOH Screenings   Food Insecurity: No Food Insecurity (12/15/2023)  Housing: Unknown (12/15/2023)  Transportation Needs: No Transportation Needs (12/15/2023)  Utilities: Not At Risk (12/15/2023)  Alcohol Screen: Low Risk  (07/08/2023)  Depression (PHQ2-9): Medium Risk (12/15/2023)  Financial Resource Strain: Low Risk  (07/08/2023)  Physical Activity: Inactive (12/15/2023)  Social Connections: Moderately Isolated (12/15/2023)  Stress: No Stress Concern Present (12/15/2023)  Tobacco Use: Medium Risk (12/15/2023)  Health Literacy: Adequate Health Literacy (12/15/2023)   See flowsheets for full screening details  Depression Screen PHQ 2 & 9 Depression Scale- Over the past 2 weeks, how often have you been bothered by any of the following problems? Little interest or pleasure in doing things: 1 (cat passed away  yesterday) Feeling down, depressed, or hopeless (PHQ Adolescent also includes...irritable): 1 PHQ-2 Total Score: 2 Trouble falling or staying asleep, or sleeping too much: 0 Feeling tired or having little energy: 3 (Works a lot and drives a lot. 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 Or the opposite -  being so fidgety or restless that you have been moving around a lot more than usual: 1 (Feels like he speaks slower without dentures) Thoughts that you would be better off dead, or of hurting yourself in some way: 0 PHQ-9 Total Score: 6 If you checked off any problems, how difficult have these problems made it for you to do your work, take care of things at home, or get along with other people?: Not difficult at all     Goals Addressed               This Visit's Progress     patient stated (pt-stated)        Patient stated he hopes to have his house paid off by next year because he wants to retire next year.        Visit info / Clinical Intake: Medicare Wellness Visit Type:: Subsequent Annual Wellness Visit Persons participating in visit:: patient Medicare Wellness Visit Mode:: In-person (required for WTM) Information given by:: patient Interpreter Needed?: No Pre-visit prep was completed: no AWV questionnaire completed by patient prior to visit?: no Living arrangements:: lives with spouse/significant other Patient's Overall Health Status Rating: (!) fair Typical amount of pain: none Does pain affect daily life?: no Are you currently prescribed opioids?: no  Dietary Habits and Nutritional Risks How many meals a day?: 2 Eats fruit and vegetables daily?: yes Most meals are obtained by: preparing own meals; eating out (eats out about every 2 weeks) In the last 2 weeks, have you had any of the following?: unintentional weight loss (Having dental procedures done) Diabetic:: no  Functional Status Activities of Daily Living (to include ambulation/medication): Independent Ambulation: Independent Medication Administration: Independent Home Management: Independent Manage your own finances?: (!) no (wife helps) Primary transportation is: driving Concerns about vision?: no *vision screening is required for WTM* (wears glasses to see and read) Concerns about hearing?: no  Fall  Screening Falls in the past year?: 0 Number of falls in past year: 0 Was there an injury with Fall?: 0 Fall Risk Category Calculator: 0 Patient Fall Risk Level: Low Fall Risk  Fall Risk Patient at Risk for Falls Due to: No Fall Risks Fall risk Follow up: Falls evaluation completed; Education provided; Falls prevention discussed  Home and Transportation Safety: All rugs have non-skid backing?: (!) no All stairs or steps have railings?: yes Grab bars in the bathtub or shower?: yes Have non-skid surface in bathtub or shower?: yes Good home lighting?: yes Regular seat belt use?: yes Hospital stays in the last year:: no  Cognitive Assessment Difficulty concentrating, remembering, or making decisions? : yes (Memory is not as best as it was 30 years ago but stays active going to work.) Will 6CIT or Mini Cog be Completed: yes What year is it?: 0 points What month is it?: 0 points Give patient an address  phrase to remember (5 components): [address] [place] About what time is it?: 0 points Count backwards from 20 to 1: 0 points Say the months of the year in reverse: 0 points Repeat the address phrase from earlier: 0 points 6 CIT Score: 0 points  Advance Directives (For Healthcare) Does Patient Have a Medical Advance Directive?: No Would patient like information on creating a medical advance directive?: Yes (MAU/Ambulatory/Procedural Areas - Information given)  Reviewed/Updated  Reviewed/Updated: Reviewed All (Medical, Surgical, Family, Medications, Allergies, Care Teams, Patient Goals); Medical History; Surgical History; Family History; Medications; Allergies; Care Teams; Patient Goals        Objective:    Today's Vitals   12/15/23 1112  BP: 127/83  Pulse: 63  Resp: 16  Temp: 98.5 F (36.9 C)  TempSrc: Oral  SpO2: 96%  Weight: 262 lb 12.8 oz (119.2 kg)  Height: 6' 1 (1.854 m)  PainSc: 0-No pain   Body mass index is 34.67 kg/m.  Current Medications  (verified) Outpatient Encounter Medications as of 12/15/2023  Medication Sig   albuterol  (PROVENTIL ) (2.5 MG/3ML) 0.083% nebulizer solution Take 3 mLs (2.5 mg total) by nebulization every 6 (six) hours as needed for wheezing or shortness of breath.   albuterol  (VENTOLIN  HFA) 108 (90 Base) MCG/ACT inhaler Inhale 1-2 puffs into the lungs every 6 (six) hours as needed for wheezing or shortness of breath.   [DISCONTINUED] cyclobenzaprine  (FLEXERIL ) 10 MG tablet Take 1 tablet (10 mg total) by mouth 2 (two) times daily as needed for muscle spasms.   No facility-administered encounter medications on file as of 12/15/2023.   Hearing/Vision screen Hearing Screening - Comments:: No hearing concerns  Vision Screening - Comments:: Wears glasses for reading and seeing Immunizations and Health Maintenance Health Maintenance  Topic Date Due   Hepatitis C Screening  Never done   Colonoscopy  Never done   Zoster Vaccines- Shingrix (1 of 2) 03/16/2024 (Originally 08/04/2005)   Influenza Vaccine  04/18/2024 (Originally 08/20/2023)   DTaP/Tdap/Td (1 - Tdap) 12/14/2024 (Originally 08/05/1974)   Pneumococcal Vaccine: 50+ Years (1 of 1 - PCV) 12/14/2024 (Originally 08/04/2005)   Medicare Annual Wellness (AWV)  12/14/2024   Meningococcal B Vaccine  Aged Out   COVID-19 Vaccine  Discontinued        Assessment/Plan:  This is a routine wellness examination for Jacob Page.  Patient Care Team: Dottie Norleen PHEBE PONCE, MD as PCP - General (Family Medicine)  I have personally reviewed and noted the following in the patient's chart:   Medical and social history Use of alcohol, tobacco or illicit drugs  Current medications and supplements including opioid prescriptions. Functional ability and status Nutritional status Physical activity Advanced directives List of other physicians Hospitalizations, surgeries, and ER visits in previous 12 months Vitals Screenings to include cognitive, depression, and falls Referrals and  appointments  No orders of the defined types were placed in this encounter.  In addition, I have reviewed and discussed with patient certain preventive protocols, quality metrics, and best practice recommendations. A written personalized care plan for preventive services as well as general preventive health recommendations were provided to patient.   Carlie Agent, CMA   12/15/2023   Return in 1 year (on 12/14/2024).  After Visit Summary: (In Person-Printed) AVS printed and given to the patient  Nurse Notes: Pt needs colonoscopy referral. He will go to [place].

## 2023-12-15 NOTE — Patient Instructions (Signed)
 Mr. Jacob Page,  Thank you for taking the time for your Medicare Wellness Visit. I appreciate your continued commitment to your health goals. Please review the care plan we discussed, and feel free to reach out if I can assist you further.  Please note that Annual Wellness Visits do not include a physical exam. Some assessments may be limited, especially if the visit was conducted virtually. If needed, we may recommend an in-person follow-up with your provider.  Ongoing Care Seeing your primary care provider every 3 to 6 months helps us  monitor your health and provide consistent, personalized care.   Referrals If a referral was made during today's visit and you haven't received any updates within two weeks, please contact the referred provider directly to check on the status.  Recommended Screenings:  Health Maintenance  Topic Date Due   Hepatitis C Screening  Never done   Colon Cancer Screening  Never done   Zoster (Shingles) Vaccine (1 of 2) 03/16/2024*   Flu Shot  04/18/2024*   DTaP/Tdap/Td vaccine (1 - Tdap) 12/14/2024*   Pneumococcal Vaccine for age over 43 (1 of 1 - PCV) 12/14/2024*   Medicare Annual Wellness Visit  12/14/2024   Meningitis B Vaccine  Aged Out   COVID-19 Vaccine  Discontinued  *Topic was postponed. The date shown is not the original due date.       12/15/2023   11:18 AM  Advanced Directives  Does Patient Have a Medical Advance Directive? No  Would patient like information on creating a medical advance directive? Yes (MAU/Ambulatory/Procedural Areas - Information given)    Vision: Annual vision screenings are recommended for early detection of glaucoma, cataracts, and diabetic retinopathy. These exams can also reveal signs of chronic conditions such as diabetes and high blood pressure.  Dental: Annual dental screenings help detect early signs of oral cancer, gum disease, and other conditions linked to overall health, including heart disease and  diabetes.  Please see the attached documents for additional preventive care recommendations.

## 2023-12-24 ENCOUNTER — Encounter (HOSPITAL_COMMUNITY): Payer: Self-pay | Admitting: General Surgery
